# Patient Record
Sex: Male | Born: 1945 | Race: White | Hispanic: No | Marital: Married | Smoking: Never smoker
Health system: Southern US, Community
[De-identification: ages and names within clinical notes are randomized; demographics above are authoritative.]

## PROBLEM LIST (undated history)

## (undated) DIAGNOSIS — K219 Gastro-esophageal reflux disease without esophagitis: Secondary | ICD-10-CM

## (undated) DIAGNOSIS — M1990 Unspecified osteoarthritis, unspecified site: Secondary | ICD-10-CM

## (undated) DIAGNOSIS — E119 Type 2 diabetes mellitus without complications: Secondary | ICD-10-CM

## (undated) DIAGNOSIS — K7460 Unspecified cirrhosis of liver: Secondary | ICD-10-CM

## (undated) DIAGNOSIS — I2510 Atherosclerotic heart disease of native coronary artery without angina pectoris: Secondary | ICD-10-CM

## (undated) DIAGNOSIS — C61 Malignant neoplasm of prostate: Secondary | ICD-10-CM

## (undated) DIAGNOSIS — R790 Abnormal level of blood mineral: Secondary | ICD-10-CM

## (undated) DIAGNOSIS — L409 Psoriasis, unspecified: Secondary | ICD-10-CM

## (undated) HISTORY — PX: CHOLECYSTECTOMY: SHX55

## (undated) HISTORY — PX: APPENDECTOMY: SHX54

## (undated) HISTORY — DX: Abnormal level of blood mineral: R79.0

## (undated) HISTORY — PX: OTHER SURGICAL HISTORY: SHX169

## (undated) HISTORY — PX: EYE SURGERY: SHX253

## (undated) HISTORY — PX: VASECTOMY: SHX75

## (undated) HISTORY — PX: PROSTATE BIOPSY: SHX241

## (undated) HISTORY — PX: PATELLA FRACTURE SURGERY: SHX735

---

## 1988-10-01 HISTORY — PX: PATELLA FRACTURE SURGERY: SHX735

## 1989-10-01 HISTORY — PX: OTHER SURGICAL HISTORY: SHX169

## 2001-02-25 ENCOUNTER — Ambulatory Visit (HOSPITAL_COMMUNITY): Admission: RE | Admit: 2001-02-25 | Discharge: 2001-02-25 | Payer: Self-pay | Admitting: Gastroenterology

## 2001-02-25 ENCOUNTER — Encounter (INDEPENDENT_AMBULATORY_CARE_PROVIDER_SITE_OTHER): Payer: Self-pay | Admitting: Specialist

## 2002-12-02 ENCOUNTER — Ambulatory Visit (HOSPITAL_COMMUNITY): Admission: RE | Admit: 2002-12-02 | Discharge: 2002-12-02 | Payer: Self-pay | Admitting: *Deleted

## 2002-12-02 ENCOUNTER — Encounter (INDEPENDENT_AMBULATORY_CARE_PROVIDER_SITE_OTHER): Payer: Self-pay | Admitting: *Deleted

## 2003-10-02 HISTORY — PX: CORONARY ARTERY BYPASS GRAFT: SHX141

## 2003-10-19 ENCOUNTER — Ambulatory Visit (HOSPITAL_COMMUNITY): Admission: RE | Admit: 2003-10-19 | Discharge: 2003-10-19 | Payer: Self-pay | Admitting: Cardiology

## 2003-10-21 ENCOUNTER — Ambulatory Visit (HOSPITAL_COMMUNITY): Admission: RE | Admit: 2003-10-21 | Discharge: 2003-10-22 | Payer: Self-pay | Admitting: Cardiology

## 2003-10-25 ENCOUNTER — Inpatient Hospital Stay (HOSPITAL_COMMUNITY): Admission: EM | Admit: 2003-10-25 | Discharge: 2003-11-02 | Payer: Self-pay | Admitting: *Deleted

## 2003-10-26 ENCOUNTER — Encounter (INDEPENDENT_AMBULATORY_CARE_PROVIDER_SITE_OTHER): Payer: Self-pay | Admitting: Specialist

## 2003-11-29 ENCOUNTER — Encounter
Admission: RE | Admit: 2003-11-29 | Discharge: 2003-11-29 | Payer: Self-pay | Admitting: Thoracic Surgery (Cardiothoracic Vascular Surgery)

## 2005-03-03 ENCOUNTER — Encounter: Payer: Self-pay | Admitting: Emergency Medicine

## 2005-03-03 ENCOUNTER — Ambulatory Visit: Payer: Self-pay | Admitting: Pulmonary Disease

## 2005-03-03 ENCOUNTER — Inpatient Hospital Stay (HOSPITAL_COMMUNITY): Admission: AD | Admit: 2005-03-03 | Discharge: 2005-03-20 | Payer: Self-pay | Admitting: *Deleted

## 2005-03-03 ENCOUNTER — Ambulatory Visit: Payer: Self-pay | Admitting: Infectious Diseases

## 2005-03-05 ENCOUNTER — Encounter: Payer: Self-pay | Admitting: Cardiology

## 2005-03-12 ENCOUNTER — Encounter (INDEPENDENT_AMBULATORY_CARE_PROVIDER_SITE_OTHER): Payer: Self-pay | Admitting: *Deleted

## 2005-04-09 ENCOUNTER — Inpatient Hospital Stay (HOSPITAL_COMMUNITY): Admission: EM | Admit: 2005-04-09 | Discharge: 2005-04-13 | Payer: Self-pay | Admitting: Emergency Medicine

## 2005-04-12 ENCOUNTER — Encounter (INDEPENDENT_AMBULATORY_CARE_PROVIDER_SITE_OTHER): Payer: Self-pay | Admitting: Specialist

## 2005-04-14 ENCOUNTER — Ambulatory Visit (HOSPITAL_COMMUNITY): Admission: RE | Admit: 2005-04-14 | Discharge: 2005-04-14 | Payer: Self-pay | Admitting: *Deleted

## 2005-04-21 ENCOUNTER — Inpatient Hospital Stay (HOSPITAL_COMMUNITY): Admission: EM | Admit: 2005-04-21 | Discharge: 2005-04-27 | Payer: Self-pay | Admitting: Emergency Medicine

## 2005-04-21 ENCOUNTER — Ambulatory Visit: Payer: Self-pay | Admitting: Internal Medicine

## 2006-10-12 ENCOUNTER — Ambulatory Visit (HOSPITAL_COMMUNITY): Admission: RE | Admit: 2006-10-12 | Discharge: 2006-10-12 | Payer: Self-pay | Admitting: Orthopedic Surgery

## 2008-12-04 ENCOUNTER — Inpatient Hospital Stay (HOSPITAL_COMMUNITY): Admission: EM | Admit: 2008-12-04 | Discharge: 2008-12-10 | Payer: Self-pay | Admitting: Emergency Medicine

## 2008-12-04 ENCOUNTER — Ambulatory Visit: Payer: Self-pay | Admitting: Emergency Medicine

## 2009-12-12 ENCOUNTER — Encounter: Admission: RE | Admit: 2009-12-12 | Discharge: 2009-12-12 | Payer: Self-pay | Admitting: Gastroenterology

## 2010-08-01 ENCOUNTER — Encounter: Admission: RE | Admit: 2010-08-01 | Discharge: 2010-08-01 | Payer: Self-pay | Admitting: Gastroenterology

## 2010-08-15 ENCOUNTER — Ambulatory Visit (HOSPITAL_COMMUNITY): Admission: RE | Admit: 2010-08-15 | Discharge: 2010-08-15 | Payer: Self-pay | Admitting: Cardiology

## 2010-10-22 ENCOUNTER — Encounter: Payer: Self-pay | Admitting: *Deleted

## 2010-10-30 ENCOUNTER — Encounter
Admission: RE | Admit: 2010-10-30 | Discharge: 2010-10-30 | Payer: Self-pay | Source: Home / Self Care | Attending: Cardiology | Admitting: Cardiology

## 2010-11-09 ENCOUNTER — Other Ambulatory Visit: Payer: Self-pay | Admitting: Gastroenterology

## 2010-11-09 DIAGNOSIS — K7460 Unspecified cirrhosis of liver: Secondary | ICD-10-CM

## 2010-11-14 ENCOUNTER — Ambulatory Visit
Admission: RE | Admit: 2010-11-14 | Discharge: 2010-11-14 | Disposition: A | Discharge status: Home / Self Care | Payer: BLUE CROSS/BLUE SHIELD | Source: Ambulatory Visit | Attending: Gastroenterology | Admitting: Gastroenterology

## 2010-11-14 DIAGNOSIS — K7460 Unspecified cirrhosis of liver: Secondary | ICD-10-CM

## 2010-11-14 MED ORDER — IOHEXOL 300 MG/ML  SOLN
100.0000 mL | Freq: Once | INTRAMUSCULAR | Status: AC | PRN
  Administered 2010-11-14: 100 mL via INTRAVENOUS

## 2010-12-07 ENCOUNTER — Other Ambulatory Visit: Payer: Self-pay | Admitting: Gastroenterology

## 2010-12-08 ENCOUNTER — Ambulatory Visit
Admission: RE | Admit: 2010-12-08 | Discharge: 2010-12-08 | Disposition: A | Discharge status: Home / Self Care | Payer: Self-pay | Source: Ambulatory Visit | Attending: Gastroenterology | Admitting: Gastroenterology

## 2010-12-08 ENCOUNTER — Other Ambulatory Visit (HOSPITAL_COMMUNITY): Payer: Self-pay | Admitting: Gastroenterology

## 2010-12-08 DIAGNOSIS — K7460 Unspecified cirrhosis of liver: Secondary | ICD-10-CM

## 2010-12-10 ENCOUNTER — Inpatient Hospital Stay (HOSPITAL_COMMUNITY)
Admission: EM | Admit: 2010-12-10 | Discharge: 2010-12-14 | DRG: 552 | Disposition: A | Discharge status: Home / Self Care | Payer: BC Managed Care – HMO | Attending: Internal Medicine | Admitting: Internal Medicine

## 2010-12-10 ENCOUNTER — Inpatient Hospital Stay (HOSPITAL_COMMUNITY): Payer: BC Managed Care – HMO

## 2010-12-10 DIAGNOSIS — Z951 Presence of aortocoronary bypass graft: Secondary | ICD-10-CM

## 2010-12-10 DIAGNOSIS — I10 Essential (primary) hypertension: Secondary | ICD-10-CM | POA: Diagnosis present

## 2010-12-10 DIAGNOSIS — K7460 Unspecified cirrhosis of liver: Secondary | ICD-10-CM | POA: Diagnosis present

## 2010-12-10 DIAGNOSIS — R188 Other ascites: Secondary | ICD-10-CM | POA: Diagnosis present

## 2010-12-10 DIAGNOSIS — A0472 Enterocolitis due to Clostridium difficile, not specified as recurrent: Secondary | ICD-10-CM | POA: Diagnosis present

## 2010-12-10 DIAGNOSIS — K2211 Ulcer of esophagus with bleeding: Principal | ICD-10-CM | POA: Diagnosis present

## 2010-12-10 DIAGNOSIS — K652 Spontaneous bacterial peritonitis: Secondary | ICD-10-CM | POA: Diagnosis present

## 2010-12-10 DIAGNOSIS — L4050 Arthropathic psoriasis, unspecified: Secondary | ICD-10-CM | POA: Diagnosis present

## 2010-12-10 DIAGNOSIS — D696 Thrombocytopenia, unspecified: Secondary | ICD-10-CM | POA: Diagnosis present

## 2010-12-10 DIAGNOSIS — I2510 Atherosclerotic heart disease of native coronary artery without angina pectoris: Secondary | ICD-10-CM | POA: Diagnosis present

## 2010-12-10 DIAGNOSIS — D62 Acute posthemorrhagic anemia: Secondary | ICD-10-CM | POA: Diagnosis present

## 2010-12-10 LAB — URINALYSIS, ROUTINE W REFLEX MICROSCOPIC
Bilirubin Urine: NEGATIVE
Glucose, UA: NEGATIVE mg/dL
Hgb urine dipstick: NEGATIVE
Ketones, ur: NEGATIVE mg/dL
Nitrite: NEGATIVE
Protein, ur: NEGATIVE mg/dL
Specific Gravity, Urine: 1.018 (ref 1.005–1.030)
Urobilinogen, UA: 1 mg/dL (ref 0.0–1.0)
pH: 6 (ref 5.0–8.0)

## 2010-12-10 LAB — COMPREHENSIVE METABOLIC PANEL
ALT: 23 U/L (ref 0–53)
AST: 49 U/L — ABNORMAL HIGH (ref 0–37)
Albumin: 2 g/dL — ABNORMAL LOW (ref 3.5–5.2)
CO2: 24 mEq/L (ref 19–32)
Calcium: 7.9 mg/dL — ABNORMAL LOW (ref 8.4–10.5)
Creatinine, Ser: 1.32 mg/dL (ref 0.4–1.5)
GFR calc Af Amer: >60 mL/min (ref >60)
Sodium: 130 mEq/L — ABNORMAL LOW (ref 135–145)

## 2010-12-10 LAB — DIFFERENTIAL
Basophils Absolute: 0 10*3/uL (ref 0.0–0.1)
Eosinophils Absolute: 0.2 10*3/uL (ref 0.0–0.7)
Eosinophils Relative: 4 % (ref 0–5)
Lymphocytes Relative: 23 % (ref 12–46)
Lymphs Abs: 1.3 10*3/uL (ref 0.7–4.0)
Neutrophils Relative %: 59 % (ref 43–77)

## 2010-12-10 LAB — LIPASE, BLOOD: Lipase: 39 U/L (ref 11–59)

## 2010-12-10 LAB — GLUCOSE, CAPILLARY: Glucose-Capillary: 106 mg/dL — ABNORMAL HIGH (ref 70–99)

## 2010-12-10 LAB — CBC
HCT: 26.6 % — ABNORMAL LOW (ref 39.0–52.0)
MCV: 93.3 fL (ref 78.0–100.0)
Platelets: 84 10*3/uL — ABNORMAL LOW (ref 150–400)
RBC: 2.85 MIL/uL — ABNORMAL LOW (ref 4.22–5.81)
RDW: 14.5 % (ref 11.5–15.5)
WBC: 5.5 10*3/uL (ref 4.0–10.5)

## 2010-12-10 LAB — OCCULT BLOOD, POC DEVICE: Fecal Occult Bld: POSITIVE

## 2010-12-10 LAB — PROTIME-INR: INR: 1.78 — ABNORMAL HIGH (ref 0.00–1.49)

## 2010-12-11 ENCOUNTER — Other Ambulatory Visit (HOSPITAL_COMMUNITY): Payer: BC Managed Care – HMO

## 2010-12-11 LAB — CBC
HCT: 22 % — ABNORMAL LOW (ref 39.0–52.0)
Hemoglobin: 8 g/dL — ABNORMAL LOW (ref 13.0–17.0)
Hemoglobin: 8.8 g/dL — ABNORMAL LOW (ref 13.0–17.0)
MCH: 32.3 pg (ref 26.0–34.0)
MCH: 32 pg (ref 26.0–34.0)
MCHC: 34.5 g/dL (ref 30.0–36.0)
MCHC: 35 g/dL (ref 30.0–36.0)
MCV: 93.5 fL (ref 78.0–100.0)
MCV: 94 fL (ref 78.0–100.0)
MCV: 93.9 fL (ref 78.0–100.0)
Platelets: 63 10*3/uL — ABNORMAL LOW (ref 150–400)
Platelets: 88 10*3/uL — ABNORMAL LOW (ref 150–400)
Platelets: 68 10*3/uL — ABNORMAL LOW (ref 150–400)
RBC: 2.78 MIL/uL — ABNORMAL LOW (ref 4.22–5.81)
RBC: 2.74 MIL/uL — ABNORMAL LOW (ref 4.22–5.81)
RDW: 14.8 % (ref 11.5–15.5)
RDW: 14.7 % (ref 11.5–15.5)
RDW: 15.7 % — ABNORMAL HIGH (ref 11.5–15.5)
WBC: 3.8 10*3/uL — ABNORMAL LOW (ref 4.0–10.5)
WBC: 5.5 10*3/uL (ref 4.0–10.5)
WBC: 3.8 10*3/uL — ABNORMAL LOW (ref 4.0–10.5)

## 2010-12-11 LAB — BASIC METABOLIC PANEL
Calcium: 7.5 mg/dL — ABNORMAL LOW (ref 8.4–10.5)
Creatinine, Ser: 1.1 mg/dL (ref 0.4–1.5)
GFR calc Af Amer: >60 mL/min (ref >60)
GFR calc non Af Amer: >60 mL/min (ref >60)
Sodium: 134 mEq/L — ABNORMAL LOW (ref 135–145)

## 2010-12-11 LAB — HEPATIC FUNCTION PANEL
Alkaline Phosphatase: 102 U/L (ref 39–117)
Bilirubin, Direct: 0.6 mg/dL — ABNORMAL HIGH (ref 0.0–0.3)
Total Bilirubin: 2.7 mg/dL — ABNORMAL HIGH (ref 0.3–1.2)

## 2010-12-11 LAB — CARDIAC PANEL(CRET KIN+CKTOT+MB+TROPI)
CK, MB: 1.1 ng/mL (ref 0.3–4.0)
CK, MB: 1.2 ng/mL (ref 0.3–4.0)
Relative Index: INVALID (ref 0.0–2.5)
Total CK: 54 U/L (ref 7–232)
Troponin I: 0.03 ng/mL (ref 0.00–0.06)

## 2010-12-11 LAB — GLUCOSE, CAPILLARY
Glucose-Capillary: 95 mg/dL (ref 70–99)
Glucose-Capillary: 100 mg/dL — ABNORMAL HIGH (ref 70–99)
Glucose-Capillary: 87 mg/dL (ref 70–99)

## 2010-12-11 LAB — PROTIME-INR
INR: 2.04 — ABNORMAL HIGH (ref 0.00–1.49)
Prothrombin Time: 23.2 seconds — ABNORMAL HIGH (ref 11.6–15.2)

## 2010-12-11 LAB — FOLATE: Folate: >20 ng/mL

## 2010-12-11 LAB — VITAMIN B12: Vitamin B-12: 522 pg/mL (ref 211–911)

## 2010-12-11 LAB — FERRITIN: Ferritin: 31 ng/mL (ref 22–322)

## 2010-12-11 LAB — HEMOGLOBIN A1C: Hgb A1c MFr Bld: 4.8 % (ref <5.7)

## 2010-12-11 LAB — CLOSTRIDIUM DIFFICILE BY PCR

## 2010-12-11 LAB — MAGNESIUM: Magnesium: 1.6 mg/dL (ref 1.5–2.5)

## 2010-12-12 ENCOUNTER — Inpatient Hospital Stay (HOSPITAL_COMMUNITY): Payer: BC Managed Care – HMO

## 2010-12-12 ENCOUNTER — Other Ambulatory Visit: Payer: Self-pay | Admitting: Interventional Radiology

## 2010-12-12 LAB — URINE CULTURE
Colony Count: NO GROWTH
Culture  Setup Time: 201203120935
Special Requests: NEGATIVE

## 2010-12-12 LAB — GLUCOSE, CAPILLARY
Glucose-Capillary: 94 mg/dL (ref 70–99)
Glucose-Capillary: 81 mg/dL (ref 70–99)

## 2010-12-12 LAB — COMPREHENSIVE METABOLIC PANEL
Albumin: 1.9 g/dL — ABNORMAL LOW (ref 3.5–5.2)
Alkaline Phosphatase: 94 U/L (ref 39–117)
BUN: 18 mg/dL (ref 6–23)
Chloride: 107 mEq/L (ref 96–112)
Creatinine, Ser: 1.01 mg/dL (ref 0.4–1.5)
Glucose, Bld: 100 mg/dL — ABNORMAL HIGH (ref 70–99)
Total Bilirubin: 3.6 mg/dL — ABNORMAL HIGH (ref 0.3–1.2)

## 2010-12-12 LAB — BODY FLUID CELL COUNT WITH DIFFERENTIAL
Eos, Fluid: 0 %
Neutrophil Count, Fluid: 14 % (ref 0–25)

## 2010-12-12 LAB — CBC
HCT: 21.9 % — ABNORMAL LOW (ref 39.0–52.0)
HCT: 24.9 % — ABNORMAL LOW (ref 39.0–52.0)
MCH: 32.2 pg (ref 26.0–34.0)
MCH: 32.3 pg (ref 26.0–34.0)
MCHC: 34.9 g/dL (ref 30.0–36.0)
MCV: 91.6 fL (ref 78.0–100.0)
Platelets: 61 10*3/uL — ABNORMAL LOW (ref 150–400)
RDW: 15.5 % (ref 11.5–15.5)
RDW: 15.7 % — ABNORMAL HIGH (ref 11.5–15.5)

## 2010-12-12 LAB — PREPARE FRESH FROZEN PLASMA: Unit division: 0

## 2010-12-12 LAB — ALBUMIN, FLUID (OTHER): Albumin, Fluid: <1 g/dL

## 2010-12-12 LAB — PROTIME-INR
INR: 1.74 — ABNORMAL HIGH (ref 0.00–1.49)
Prothrombin Time: 20.5 seconds — ABNORMAL HIGH (ref 11.6–15.2)

## 2010-12-12 LAB — SURGICAL PCR SCREEN: Staphylococcus aureus: NEGATIVE

## 2010-12-13 LAB — CBC
HCT: 27.5 % — ABNORMAL LOW (ref 39.0–52.0)
Hemoglobin: 9.5 g/dL — ABNORMAL LOW (ref 13.0–17.0)
MCHC: 34.5 g/dL (ref 30.0–36.0)
WBC: 4.5 10*3/uL (ref 4.0–10.5)

## 2010-12-13 LAB — BASIC METABOLIC PANEL
Chloride: 108 mEq/L (ref 96–112)
GFR calc Af Amer: >60 mL/min (ref >60)
Potassium: 3.9 mEq/L (ref 3.5–5.1)

## 2010-12-13 LAB — PATHOLOGIST SMEAR REVIEW

## 2010-12-13 LAB — GLUCOSE, CAPILLARY
Glucose-Capillary: 129 mg/dL — ABNORMAL HIGH (ref 70–99)
Glucose-Capillary: 119 mg/dL — ABNORMAL HIGH (ref 70–99)
Glucose-Capillary: 127 mg/dL — ABNORMAL HIGH (ref 70–99)

## 2010-12-13 LAB — PROTIME-INR: INR: 1.79 — ABNORMAL HIGH (ref 0.00–1.49)

## 2010-12-14 ENCOUNTER — Other Ambulatory Visit (HOSPITAL_COMMUNITY): Payer: Self-pay

## 2010-12-14 LAB — CROSSMATCH
ABO/RH(D): B POS
Unit division: 0
Unit division: 0
Unit division: 0

## 2010-12-14 LAB — CBC
MCH: 33 pg (ref 26.0–34.0)
MCV: 93.2 fL (ref 78.0–100.0)
Platelets: 72 10*3/uL — ABNORMAL LOW (ref 150–400)
RDW: 15.4 % (ref 11.5–15.5)

## 2010-12-17 LAB — CULTURE, BLOOD (ROUTINE X 2)
Culture  Setup Time: 201203120918
Culture: NO GROWTH

## 2010-12-21 NOTE — Discharge Summary (Signed)
[name], [name]                ACCOUNT NO.:  [id number]  MEDICAL RECORD NO.:  [id number]           PATIENT TYPE:  I  LOCATION:  2506                         FACILITY:  [facility]  PHYSICIAN:  Alan Massed, MD    DATE OF BIRTH:  [birth year]  DATE OF ADMISSION:  12/10/2010 DATE OF DISCHARGE:                        DISCHARGE SUMMARY - REFERRING   PRIMARY CARE PRACTITIONER:  Dr. Aida [name].  PRIMARY GASTROENTEROLOGIST:  Alan Friar, MD.  PRIMARY CARDIOLOGIST:  Alan Kotyk R. Jacinto Halim, MD  PRIMARY DISCHARGE DIAGNOSES: 1. Upper GI bleed secondary to distal esophageal ulcer. 2. Abdominal pain presumed secondary to spontaneous bacterial     peritonitis. 3. Ascites status post paracentesis. 4. C. difficile colitis.  SECONDARY DISCHARGE DIAGNOSES: 1. Cirrhosis of the liver. 2. Coronary artery disease status post CABG. 3. Hypertension. 4. History of psoriatic arthritis. 5. History of gastroesophageal reflux disease. 6. 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  DISCHARGE MEDICATIONS: 1. Ciprofloxacin 500 mg 1 tablet p.o. twice daily. 2. Lasix 40 mg 1 tablet p.o. daily. 3. Metronidazole 500 mg 1 tablet p.o. 3 times a day for 8 more days. 4. Protonix 40 mg 1 tablet p.o. daily. 5. Inderal 10 mg 1 tablet twice daily. 6. Aldactone 50 mg 1 tablet p.o. daily. 7. Sucralfate 1 gram p.o. 4 times a day. 8. Aspirin 81 mg 1 tablet daily. 9. Calcium carbonate/vitamin D 600/400, 1 tablet p.o. daily. 10.Enbrel 50 mg 1 tablet p.o. weekly. 11.Fish oil 1200 mg 3 capsules p.o. daily. 12.Tramadol 50 mg 1 tablet p.o. daily. 13.Folic acid 0.4 mg 1 tablet daily. 14.Vitamin D3 400 units 1 capsule p.o. daily. 15.Flora-Q 1 tablet p.o. daily.  CONSULTATIONS:  Dr. Vassie [name] from [facility].  HISTORY OF PRESENT ILLNESS:  The patient is a very pleasant 65 year old white male with the above-noted medical problems who was brought in on December 10, 2010 for abdominal swelling and pain.  He also claimed he  was having melanotic stools.  He was then admitted to the hospitalist service for further evaluation and treatment.  For further details, please see the history and physical that was dictated by Dr. Toniann [name] on admission.  PROCEDURES PERFORMED:  The patient underwent a ultrasound guided paracentesis on December 12, 2010 with removal of 4.5 liters of fluid.  RADIOLOGICAL STUDIES:  CT of the abdomen and pelvis done on December 11, 2010 showed cirrhosis, ascites, and portal venous hypertension. Diverticulosis without evidence of acute diverticulitis.  No evidence of bowel obstruction.  Extensive coronary artery calcification.  CBC on discharge showed hemoglobin of 8.7, platelet count of 72,000.  BRIEF HOSPITAL COURSE: 1. Melanotic stools.  The patient did have evidence of melena on     admission.  Since his INR was high, he was given fresh frozen     plasma.  He was seen in consult by Dr. Dulce [name] and EGD was done     which showed a distal esophageal ulcer.  The patient was then     subsequently placed on sucralfate and Protonix.  I did speak with     Dr. Dulce [name] today and he was okay with 81 mg of aspirin to  continue     with.  The patient will follow with [facility] for further management     as well.  Please note his stools are now regular in color.  The     patient does have some amount of anemia and his hemoglobin has been     8.7 at the time of discharge.  His hemoglobin on admission was 9.0. 2. C. difficile colitis.  The patient did also have evidence of     diarrhea on admission and his C. difficile PCR was sent which came     back positive and subsequently the patient has been placed on     Flagyl which he will continue as noted above. 3. Abdominal pain.  This was of unknown etiology.  CT scan of the     abdomen was negative for any significant acute pathology.  However,     given his history of ascites and underlying cirrhosis, the patient     was empirically started on cephalosporins  for spontaneous bacterial     peritonitis.  He was already on antibiotics for more than 48 hours     when paracentesis was done.  Therefore at this time as GI is     recommending that this patient be treated with ciprofloxacin on     discharge to complete a 10-day course. 4. Underlying cirrhosis with ascites.  The patient as noted above is     status post paracentesis.  He has also been placed on Lasix,     Aldactone, and Inderal which will be provided to the patient on     discharge.  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  He also has coagulopathy secondary to cirrhosis as     well. 5. History of psoriatic arthritis.  This is stable.  He will resume     Enbrel on discharge. 6. History of coronary artery disease status post CABG.  As per my     discussion with Dr. Dulce [name], it is okay for the patient due to     resume a baby aspirin.  DISPOSITION:  The patient is stable to be discharged home.  FOLLOWUP INSTRUCTIONS: 1. The patient will follow up with Dr. Bosie [name] within 1-2 weeks upon     discharge.  He is to call and make an appointment. 2. The patient will follow up with his primary care practitioner, Dr.     Clarene [name] within 1-2 weeks upon discharge.  He     is to call and make an appointment. 3. The patient is to consume a low sodium diet. 4. Total time spent 45 minutes.     Alan Massed, MD     SG/MEDQ  D:  12/14/2010  T:  12/14/2010  Job:  [id number]  cc:   [name] Alan Friar, MD Alan Modena, MD Alan Hilts Jacinto Halim, MD  Electronically Signed by [name]  on 12/21/2010 08:26:47 PM

## 2011-01-01 NOTE — H&P (Signed)
[name], [name]                ACCOUNT NO.:  [id number]  MEDICAL RECORD NO.:  [id number]           PATIENT TYPE:  I  LOCATION:  2506                         FACILITY:  [facility]  PHYSICIAN:  Eduard Clos, MDDATE OF BIRTH:  12/24/1945  DATE OF ADMISSION:  12/10/2010 DATE OF DISCHARGE:                             HISTORY & PHYSICAL   PRIMARY CARE PHYSICIAN:  Dr. Aida Puffer.  PRIMARY GASTROENTEROLOGIST:  Shirley Friar, MD  PRIMARY CARDIOLOGIST:  Cristy Hilts. Jacinto Halim, MD  CHIEF COMPLAINT:  Abdominal swelling and pain.  HISTORY OF PRESENT ILLNESS:  A 65 year old male with known history of cirrhosis of liver, psoriatic arthritis, hypertension and coronary disease status post CABG, has been not feeling well and feeling poor over the last couple of weeks.  The patient states that he was treated for pneumonia 3 weeks ago by his primary care physician and subsequent to that he has been feeling a little weak, tired, and his abdomen has been swelling.  He was scheduled to have paracentesis next week for which he had a sonogram done on December 08, 2010, results of which shows mild to moderate ascites with hepatic cirrhosis and moderate splenomegaly and also had previous cholecystectomy.  The patient today had at least 10 bouts of bowel movement.  He states it is not diarrhea but black tarry stools.  He did not have any nausea or vomiting,  He did have 1 episode last week which he said was dark but was not obviously bloody.  In addition, the patient has been having increasing distention of the abdomen with pain particularly in the right upper quadrant which is dull, aching, sometimes colicky, has no relation to food or bowel movements.  The patient also has been having a subjective feeling of fever and chills.  The patient denies any chest pain.  Denies any palpitation, does not have any dizziness.  No loss of consciousness, did not have any focal deficit.  Did not have any dysuria  or discharges.  In the ER, the patient was found to have a hemoglobin around 9 with thrombocytopenia, mildly elevated INR.  The patient has been admitted for further workup.  PAST MEDICAL HISTORY: 1. History of CAD status post CABG. 2. History of hypertension. 3. History of cirrhosis of liver secondary to medication. 4. History of psoriatic arthritis on Enbrel now. 5. Previous history of Streptococcus pyogenes bacteriemia presumed     secondary to bacterial peritonitis. 6. History of GERD.  PAST SURGICAL HISTORY: 1. Cholecystectomy. 2. Appendectomy. 3. Repair of left femoral fracture. 4. CABG.  MEDICATIONS ON ADMISSION:  The patient is on torsemide and spironolactone, doses are not known.  He is on Enbrel per week.  He also takes fish oil, tramadol p.r.n., vitamin D3, calcium, and folic acid.  ALLERGIES:  No known drug allergies.  FAMILY HISTORY:  Significant for diabetes and CHF in the patient's mother and psoriatic problems in the patient's father.  SOCIAL HISTORY:  The patient is married, lives with his wife.  Denies smoking cigarettes, drinking alcohol, or using illegal drugs.  REVIEW OF SYSTEMS:  As per history of present illness,  nothing else significant.  PHYSICAL EXAMINATION:  GENERAL:  The patient examined at bedside, not in acute distress. VITAL SIGNS:  Blood pressure 114/76, pulse 99 per minute, temperature is 99.7 rectally, respirations 20 per minute, O2 sat 98%. HEENT:  Anicteric.  Mild pallor.  No facial asymmetry.  Tongue is midline. NECK:  No neck rigidity. CHEST:  Bilateral air entry present.  No rhonchi, no crepitation. HEART:  S1, S2  heard. ABDOMEN:  Distended.  Clinically looks like mild ascites.  Nontender. Bowel sounds present.  No guarding, no rigidity.  I do not see any discoloration now. CNS:  Alert, awake, oriented to time, place, and person.  Moves upper and lower extremities 5/5. EXTREMITIES:  There are small bruises in the lower  extremities.  There are no acute ischemic changes, cyanosis, or clubbing.  LABORATORY DATA:  EKG shows sinus rhythm, atrial premature complexes. Heart rate is around 89 beats per minute with poor R-wave progression and nonspecific ST-T changes, low-voltage.  Ultrasound of abdomen done on December 08, 2010 shows previous cholecystectomy.  No evidence of biliary ductal dilatation, hepatic cirrhosis, moderate splenomegaly, and mild-to- moderate ascites.  No definite change appreciated compared with prior exam.  CBC; WBC is 5.5, hemoglobin is 9, hematocrit is 26.6, platelets 84.  PT/INR is 20.9 and 1.7.  Complete metabolic panel; sodium 130, potassium 3.4, chloride 100, carbon dioxide 24, glucose 138, BUN 20, creatinine 1.3, total bilirubin is 3.1, alkaline  phosphatase 121, AST 29, ALT 23.  Total protein 6.4, albumin 2, calcium is 7.9, lipase 39. UA is negative.  Fecal occult blood is positive.  ASSESSMENT: 1. Gastrointestinal bleed. 2. Abdominal pain, probably multifactorial.     a.     Colitis.     b.     Spontaneous bacterial peritonitis.     c.     Increasing ascites. 3. Recent use of antibiotics and increased bowel movements. 4. 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 5. History of psoriatic arthritis, on immunosuppressants. 6. Coronary artery disease status post coronary artery bypass graft,     presently chest pain free. 7. Previous history of sepsis secondary bacterial peritonitis.  PLAN:  At this time, 1. Admit the patient to telemetry. 2. For his anemia and guaiac-positive, we are going to place the     patient n.p.o.  I am going to place the patient on a Protonix drip.     I am going to type and crossmatch 3 units PRBC and hold.  Recheck     CBC stat now and if needed transfuse.  We will have low threshold     for transfusion.  Give vitamin K 5 mg as his INR is slightly high.     I already consulted Dr. Randa Evens of GI who will be seeing the     patient later.  We  will check an anemia profile. 3. Abdominal pain with ascites at this time is concerning for SBP and     colitis and as the patient has recent antibiotics, I am going to     get a CT abdomen and pelvis with just p.o. contrast.  Check stool     studies.  I am going to empirically cover with antibiotics,     vancomycin, ceftazidime, and Flagyl which can later be tapered     based on the cultures and clinical course. 4. I am going to get a chest x-ray and 2-D echo. 5. At this time, the patient does not have any chest pain, but  I am     going to cycle the cardiac markers given his cardiac history and we     will have a low threshold for transfusion since the patient has     thrombocytopenia and also history of CABG. 6. The patient does have jaundice.  A recent sonogram did not show any     obstructive features.  We are going to closely follow his     bilirubin. 7. Further recommendation as condition evolves.  His albumin is also     low which also should be  DICTATION ENDED AT THIS POINT.     Eduard Clos, MD     ANK/MEDQ  D:  12/10/2010  T:  12/10/2010  Job:  [id number]  cc:   Aida Puffer Shirley Friar, MD Cristy Hilts Jacinto Halim, MD  Electronically Signed by Midge Minium MD on 01/01/2011 07:42:08 AM

## 2011-01-11 LAB — URINALYSIS, ROUTINE W REFLEX MICROSCOPIC
Hgb urine dipstick: NEGATIVE
Protein, ur: NEGATIVE mg/dL
Urobilinogen, UA: 1 mg/dL (ref 0.0–1.0)

## 2011-01-11 LAB — BASIC METABOLIC PANEL
BUN: 14 mg/dL (ref 6–23)
BUN: 14 mg/dL (ref 6–23)
CO2: 17 mEq/L — ABNORMAL LOW (ref 19–32)
CO2: 18 mEq/L — ABNORMAL LOW (ref 19–32)
CO2: 23 mEq/L (ref 19–32)
CO2: 24 mEq/L (ref 19–32)
Calcium: 8 mg/dL — ABNORMAL LOW (ref 8.4–10.5)
Calcium: 8.3 mg/dL — ABNORMAL LOW (ref 8.4–10.5)
Chloride: 107 mEq/L (ref 96–112)
Chloride: 110 mEq/L (ref 96–112)
Chloride: 111 mEq/L (ref 96–112)
Chloride: 114 mEq/L — ABNORMAL HIGH (ref 96–112)
Creatinine, Ser: 0.83 mg/dL (ref 0.4–1.5)
Creatinine, Ser: 1.07 mg/dL (ref 0.4–1.5)
GFR calc Af Amer: >60 mL/min (ref >60)
GFR calc Af Amer: >60 mL/min (ref >60)
GFR calc Af Amer: >60 mL/min (ref >60)
GFR calc non Af Amer: >60 mL/min (ref >60)
Glucose, Bld: 213 mg/dL — ABNORMAL HIGH (ref 70–99)
Glucose, Bld: 232 mg/dL — ABNORMAL HIGH (ref 70–99)
Glucose, Bld: 92 mg/dL (ref 70–99)
Glucose, Bld: 97 mg/dL (ref 70–99)
Potassium: 3.7 mEq/L (ref 3.5–5.1)
Sodium: 131 mEq/L — ABNORMAL LOW (ref 135–145)
Sodium: 141 mEq/L (ref 135–145)

## 2011-01-11 LAB — BLOOD GAS, ARTERIAL
Acid-base deficit: 6.7 mmol/L — ABNORMAL HIGH (ref 0.0–2.0)
Drawn by: 308601
FIO2: 0.21 %
O2 Saturation: 93.8 %
Patient temperature: 98.6
pO2, Arterial: 72.4 mmHg — ABNORMAL LOW (ref 80.0–100.0)

## 2011-01-11 LAB — DIC (DISSEMINATED INTRAVASCULAR COAGULATION)PANEL
D-Dimer, Quant: 2.11 ug/mL-FEU — ABNORMAL HIGH (ref 0.00–0.48)
Fibrinogen: 259 mg/dL (ref 204–475)
INR: 1.4 (ref 0.00–1.49)
aPTT: 33 seconds (ref 24–37)

## 2011-01-11 LAB — COMPREHENSIVE METABOLIC PANEL
Albumin: 2.3 g/dL — ABNORMAL LOW (ref 3.5–5.2)
Albumin: 3 g/dL — ABNORMAL LOW (ref 3.5–5.2)
Alkaline Phosphatase: 109 U/L (ref 39–117)
Alkaline Phosphatase: 81 U/L (ref 39–117)
Alkaline Phosphatase: 82 U/L (ref 39–117)
BUN: 16 mg/dL (ref 6–23)
BUN: 17 mg/dL (ref 6–23)
BUN: 19 mg/dL (ref 6–23)
BUN: 20 mg/dL (ref 6–23)
Calcium: 8.5 mg/dL (ref 8.4–10.5)
Chloride: 110 mEq/L (ref 96–112)
Chloride: 111 mEq/L (ref 96–112)
Creatinine, Ser: 0.83 mg/dL (ref 0.4–1.5)
Creatinine, Ser: 1.08 mg/dL (ref 0.4–1.5)
Creatinine, Ser: 1.27 mg/dL (ref 0.4–1.5)
Glucose, Bld: 149 mg/dL — ABNORMAL HIGH (ref 70–99)
Glucose, Bld: 156 mg/dL — ABNORMAL HIGH (ref 70–99)
Potassium: 3.2 mEq/L — ABNORMAL LOW (ref 3.5–5.1)
Potassium: 4.2 mEq/L (ref 3.5–5.1)
Potassium: 4.3 mEq/L (ref 3.5–5.1)
Total Bilirubin: 3.2 mg/dL — ABNORMAL HIGH (ref 0.3–1.2)
Total Bilirubin: 3.7 mg/dL — ABNORMAL HIGH (ref 0.3–1.2)
Total Protein: 5 g/dL — ABNORMAL LOW (ref 6.0–8.3)
Total Protein: 5.1 g/dL — ABNORMAL LOW (ref 6.0–8.3)
Total Protein: 6.1 g/dL (ref 6.0–8.3)

## 2011-01-11 LAB — CARDIAC PANEL(CRET KIN+CKTOT+MB+TROPI)
Relative Index: 1.9 (ref 0.0–2.5)
Troponin I: 0.01 ng/mL (ref 0.00–0.06)

## 2011-01-11 LAB — CBC
HCT: 34 % — ABNORMAL LOW (ref 39.0–52.0)
HCT: 34.2 % — ABNORMAL LOW (ref 39.0–52.0)
HCT: 34.5 % — ABNORMAL LOW (ref 39.0–52.0)
HCT: 35.3 % — ABNORMAL LOW (ref 39.0–52.0)
HCT: 39.2 % (ref 39.0–52.0)
HCT: 41.1 % (ref 39.0–52.0)
HCT: 43.1 % (ref 39.0–52.0)
Hemoglobin: 11.8 g/dL — ABNORMAL LOW (ref 13.0–17.0)
Hemoglobin: 12.1 g/dL — ABNORMAL LOW (ref 13.0–17.0)
Hemoglobin: 13.7 g/dL (ref 13.0–17.0)
Hemoglobin: 14.1 g/dL (ref 13.0–17.0)
MCHC: 34.2 g/dL (ref 30.0–36.0)
MCHC: 34.3 g/dL (ref 30.0–36.0)
MCHC: 34.4 g/dL (ref 30.0–36.0)
MCHC: 34.9 g/dL (ref 30.0–36.0)
MCV: 93.6 fL (ref 78.0–100.0)
MCV: 94.7 fL (ref 78.0–100.0)
MCV: 95.4 fL (ref 78.0–100.0)
MCV: 95.8 fL (ref 78.0–100.0)
MCV: 96.8 fL (ref 78.0–100.0)
Platelets: 52 10*3/uL — ABNORMAL LOW (ref 150–400)
Platelets: 60 10*3/uL — ABNORMAL LOW (ref 150–400)
Platelets: 64 10*3/uL — ABNORMAL LOW (ref 150–400)
Platelets: 71 10*3/uL — ABNORMAL LOW (ref 150–400)
RBC: 3.56 MIL/uL — ABNORMAL LOW (ref 4.22–5.81)
RBC: 4.39 MIL/uL (ref 4.22–5.81)
RDW: 13.3 % (ref 11.5–15.5)
RDW: 13.8 % (ref 11.5–15.5)
RDW: 14 % (ref 11.5–15.5)
RDW: 14.1 % (ref 11.5–15.5)
RDW: 14.2 % (ref 11.5–15.5)
RDW: 14.4 % (ref 11.5–15.5)
RDW: 14.6 % (ref 11.5–15.5)
WBC: 4.9 10*3/uL (ref 4.0–10.5)
WBC: 8.6 10*3/uL (ref 4.0–10.5)
WBC: 9.1 10*3/uL (ref 4.0–10.5)

## 2011-01-11 LAB — POCT I-STAT, CHEM 8
Calcium, Ion: 1.09 mmol/L — ABNORMAL LOW (ref 1.12–1.32)
Glucose, Bld: 97 mg/dL (ref 70–99)
HCT: 46 % (ref 39.0–52.0)
TCO2: 21 mmol/L (ref 0–100)

## 2011-01-11 LAB — DIFFERENTIAL
Basophils Absolute: 0.1 10*3/uL (ref 0.0–0.1)
Eosinophils Absolute: 0 10*3/uL (ref 0.0–0.7)
Lymphocytes Relative: 7 % — ABNORMAL LOW (ref 12–46)
Lymphocytes Relative: 9 % — ABNORMAL LOW (ref 12–46)
Lymphs Abs: 0.9 10*3/uL (ref 0.7–4.0)
Monocytes Absolute: 0.3 10*3/uL (ref 0.1–1.0)
Monocytes Absolute: 0.8 10*3/uL (ref 0.1–1.0)
Monocytes Relative: 8 % (ref 3–12)
Neutro Abs: 8.2 10*3/uL — ABNORMAL HIGH (ref 1.7–7.7)
Neutrophils Relative %: 83 % — ABNORMAL HIGH (ref 43–77)
Neutrophils Relative %: 89 % — ABNORMAL HIGH (ref 43–77)

## 2011-01-11 LAB — CARBOXYHEMOGLOBIN
Carboxyhemoglobin: 1.5 % (ref 0.5–1.5)
Methemoglobin: 1.4 % (ref 0.0–1.5)
O2 Saturation: 77.9 %
Total hemoglobin: 13.6 g/dL (ref 13.5–18.0)
Total hemoglobin: 14.3 g/dL (ref 13.5–18.0)

## 2011-01-11 LAB — CULTURE, BLOOD (ROUTINE X 2): Culture: NO GROWTH

## 2011-01-11 LAB — HEPARIN ANTIBODY SCREEN: Heparin Antibody Screen: POSITIVE

## 2011-01-11 LAB — AMYLASE: Amylase: 46 U/L (ref 27–131)

## 2011-01-11 LAB — PHOSPHORUS
Phosphorus: 1.4 mg/dL — ABNORMAL LOW (ref 2.3–4.6)
Phosphorus: 4.6 mg/dL (ref 2.3–4.6)
Phosphorus: <1 mg/dL — CL (ref 2.3–4.6)

## 2011-01-11 LAB — BILIRUBIN, FRACTIONATED(TOT/DIR/INDIR)
Bilirubin, Direct: 0.6 mg/dL — ABNORMAL HIGH (ref 0.0–0.3)
Indirect Bilirubin: 1.8 mg/dL — ABNORMAL HIGH (ref 0.3–0.9)
Total Bilirubin: 2.4 mg/dL — ABNORMAL HIGH (ref 0.3–1.2)

## 2011-01-11 LAB — LACTIC ACID, PLASMA
Lactic Acid, Venous: 2 mmol/L (ref 0.5–2.2)
Lactic Acid, Venous: 3.6 mmol/L — ABNORMAL HIGH (ref 0.5–2.2)
Lactic Acid, Venous: 3.8 mmol/L — ABNORMAL HIGH (ref 0.5–2.2)

## 2011-01-11 LAB — URINE CULTURE

## 2011-01-11 LAB — TYPE AND SCREEN

## 2011-01-11 LAB — CALCIUM, IONIZED: Calcium, Ion: 1.16 mmol/L (ref 1.12–1.32)

## 2011-01-11 LAB — PROTIME-INR
INR: 1.4 (ref 0.00–1.49)
INR: 1.6 — ABNORMAL HIGH (ref 0.00–1.49)
Prothrombin Time: 17.5 seconds — ABNORMAL HIGH (ref 11.6–15.2)

## 2011-01-11 LAB — MRSA CULTURE

## 2011-01-11 LAB — APTT: aPTT: 47 seconds — ABNORMAL HIGH (ref 24–37)

## 2011-01-11 LAB — HAPTOGLOBIN: Haptoglobin: 115 mg/dL (ref 16–200)

## 2011-01-11 LAB — ABO/RH: ABO/RH(D): B POS

## 2011-01-11 LAB — MAGNESIUM: Magnesium: 1.9 mg/dL (ref 1.5–2.5)

## 2011-01-11 LAB — PLT GLYCOPROTEIN (INDIRECT) AUTOABS: Plt GP Ia/IIa Autoabs: NOT DETECTED

## 2011-01-11 LAB — D-DIMER, QUANTITATIVE: D-Dimer, Quant: 0.56 ug/mL-FEU — ABNORMAL HIGH (ref 0.00–0.48)

## 2011-01-11 LAB — TSH: TSH: 0.57 u[IU]/mL (ref 0.350–4.500)

## 2011-01-11 LAB — AMMONIA: Ammonia: 29 umol/L (ref 11–35)

## 2011-01-11 LAB — CORTISOL: Cortisol, Plasma: 42.3 ug/dL

## 2011-01-17 ENCOUNTER — Other Ambulatory Visit: Payer: Self-pay | Admitting: Gastroenterology

## 2011-01-17 ENCOUNTER — Encounter (HOSPITAL_COMMUNITY): Payer: Medicare Other | Attending: Gastroenterology

## 2011-01-17 DIAGNOSIS — D649 Anemia, unspecified: Secondary | ICD-10-CM | POA: Insufficient documentation

## 2011-01-17 DIAGNOSIS — K7460 Unspecified cirrhosis of liver: Secondary | ICD-10-CM | POA: Insufficient documentation

## 2011-01-18 ENCOUNTER — Other Ambulatory Visit: Payer: Self-pay | Admitting: Gastroenterology

## 2011-01-18 ENCOUNTER — Encounter (HOSPITAL_COMMUNITY): Payer: Medicare Other

## 2011-01-18 LAB — CBC
HCT: 28.6 % — ABNORMAL LOW (ref 39.0–52.0)
Hemoglobin: 9.9 g/dL — ABNORMAL LOW (ref 13.0–17.0)
MCHC: 34.6 g/dL (ref 30.0–36.0)
RDW: 17.7 % — ABNORMAL HIGH (ref 11.5–15.5)
WBC: 3.6 10*3/uL — ABNORMAL LOW (ref 4.0–10.5)

## 2011-01-18 LAB — DIFFERENTIAL
Basophils Absolute: 0 10*3/uL (ref 0.0–0.1)
Basophils Relative: 1 % (ref 0–1)
Eosinophils Relative: 4 % (ref 0–5)
Lymphocytes Relative: 31 % (ref 12–46)
Monocytes Absolute: 0.5 10*3/uL (ref 0.1–1.0)
Neutro Abs: 1.8 10*3/uL (ref 1.7–7.7)

## 2011-01-19 LAB — CROSSMATCH
ABO/RH(D): B POS
Antibody Screen: NEGATIVE
Unit division: 0
Unit division: 0

## 2011-01-24 NOTE — Consult Note (Signed)
[name], [name]                ACCOUNT NO.:  [id number]  MEDICAL RECORD NO.:  [id number]           PATIENT TYPE:  I  LOCATION:  2506                         FACILITY:  [facility]  PHYSICIAN:  Willis Modena, MD     DATE OF BIRTH:  Aug 22, 1946  DATE OF CONSULTATION:  12/11/2010 DATE OF DISCHARGE:                                CONSULTATION   REASON FOR CONSULTATION:  Melena and weakness.  CHIEF COMPLAINT:  Weakness.  HISTORY OF PRESENT ILLNESS:  [name] is a 65 year old gentleman with history of cirrhosis.  He has psoriatic arthritis, on immunosuppressant therapy and has had recurrent bouts of pneumonia, although his recent chest x-ray yesterday shows no obvious infiltrates.  He presents to the hospital with progressive weakness.  He has been having progressive weakness for the past several weeks.  He has had intermittent black stools over this time, but had several episodes in a row yesterday, which prompted this evaluation.  He has also noticed some progressive worsening in his abdominal distention, much more so than his lower extremity distention.  He has no hematemesis.  He did have an endoscopy done about a year ago that showed some small, grade 1 esophageal varices as well as portal hypertensive gastropathy.  PAST MEDICAL HISTORY:  Per dictated note from Dr. Toniann Fail, dictated December 10, 2010, that I have reviewed and I agree to.  PAST SURGICAL HISTORY:  Per dictated note from Dr. Toniann Fail, dictated December 10, 2010, that I have reviewed and I agree to.  HOME MEDICATIONS:  Per dictated note from Dr. Toniann Fail, dictated December 10, 2010, that I have reviewed and I agree to.  ALLERGIES:  Per dictated note from Dr. Toniann Fail, dictated December 10, 2010, that I have reviewed and I agree to.  FAMILY HISTORY:  Per dictated note from Dr. Toniann Fail, dictated December 10, 2010, that I have reviewed and I agree to.  SOCIAL HISTORY:  Per dictated note from Dr. Toniann Fail, dictated  December 10, 2010, that I have reviewed and I agree to.  REVIEW OF SYSTEMS:  Per dictated note from Dr. Toniann Fail, dictated December 10, 2010, that I have reviewed and I agree to.  PHYSICAL EXAMINATION:  VITAL SIGNS:  Blood pressure about 120/70, heart rate is 80, and oxygen saturation about 95% on a couple liters. GENERAL:  [name] is chronically ill appearing, appears somewhat older than stated age.  He is not acutely toxic-appearing. HEENT:  Eyes:  Sclerae are slightly icteric.  Conjunctivae pale.  ENT: No oropharyngeal lesions. NECK:  Supple. LUNGS:  Poor aeration throughout, but no focal findings. HEART:  Regular. ABDOMEN:  Distended with ascites, mild to moderate.  Positive shifting dullness.  No obviously palpable liver or splenic enlargement, although this could be missed due to his abdominal distention.  No focal tenderness, has some mild generalized tenderness.  Bowel sounds normoactive. EXTREMITIES:  Pitting edema 1+ to knees bilaterally. SKIN:  Some scattered ecchymoses, telangiectasias, and cherry angiomata. NEUROLOGIC:  Diffusely weak, nonfocal without lateralizing signs. PSYCHIATRIC:  Normal mood and affect. LYMPHATICS:  No palpable axillary, submandibular, or supraclavicular adenopathy.  LABORATORY STUDIES:  White count is  3.8, platelet count is 63, hemoglobin 7.7, and INR is 2.04.  Sodium 134, potassium 3.3, chloride 107, bicarb 21, BUN 22, and creatinine 1.1.  Total bilirubin is 2.7, indirect is 2.1, AST 43, ALT 21, total protein 5.6, albumin 1.7, and lipase normal.  CK enzymes normal.  Urinalysis normal.  Hemoccult times one is positive.  RADIOLOGY STUDIES:  Chest x-ray done yesterday shows mild chronic interstitial disease, but no acute infiltrates.  He had an abdominal ultrasound done on March 9 that showed a nodular liver consistent with cirrhosis, mild to moderate ascites, moderate splenomegaly, prior cholecystectomy, and no biliary dilatation.  IMPRESSION:   [name] is a 65 year old gentleman with cirrhosis, presenting with melena and anemia.  His symptoms are seemingly subacute over the past couple weeks.  He is not overtly hemorrhaging and is not hemodynamically unstable.  PLAN: 1. Would pursue endoscopy today after he is given a unit of blood and     a couple units of fresh frozen plasma. 2. Would continue empiric antibiotics in the setting of GI bleed with     known ascites and cirrhosis. 3. Continue Protonix drip. 4. Will ultimately need a paracentesis, but we will defer until the     endoscopy is done. 5. We will follow along with you.  Thank you for allowing [name] to participate in the care of [name].     Willis Modena, MD     WO/MEDQ  D:  12/11/2010  T:  12/12/2010  Job:  [id number]  Electronically Signed by Willis Modena  on 01/24/2011 01:58:15 PM

## 2011-01-29 ENCOUNTER — Emergency Department (HOSPITAL_COMMUNITY): Payer: Medicare Other

## 2011-01-29 ENCOUNTER — Emergency Department (HOSPITAL_COMMUNITY)
Admission: EM | Admit: 2011-01-29 | Discharge: 2011-01-29 | Disposition: A | Discharge status: Home / Self Care | Payer: Medicare Other | Attending: Emergency Medicine | Admitting: Emergency Medicine

## 2011-01-29 DIAGNOSIS — K7460 Unspecified cirrhosis of liver: Secondary | ICD-10-CM | POA: Insufficient documentation

## 2011-01-29 DIAGNOSIS — Z7982 Long term (current) use of aspirin: Secondary | ICD-10-CM | POA: Insufficient documentation

## 2011-01-29 DIAGNOSIS — M069 Rheumatoid arthritis, unspecified: Secondary | ICD-10-CM | POA: Insufficient documentation

## 2011-01-29 DIAGNOSIS — K219 Gastro-esophageal reflux disease without esophagitis: Secondary | ICD-10-CM | POA: Insufficient documentation

## 2011-01-29 DIAGNOSIS — I10 Essential (primary) hypertension: Secondary | ICD-10-CM | POA: Insufficient documentation

## 2011-01-29 DIAGNOSIS — Z79899 Other long term (current) drug therapy: Secondary | ICD-10-CM | POA: Insufficient documentation

## 2011-01-29 DIAGNOSIS — K769 Liver disease, unspecified: Secondary | ICD-10-CM | POA: Insufficient documentation

## 2011-01-29 DIAGNOSIS — R4182 Altered mental status, unspecified: Secondary | ICD-10-CM | POA: Insufficient documentation

## 2011-01-29 DIAGNOSIS — F29 Unspecified psychosis not due to a substance or known physiological condition: Secondary | ICD-10-CM | POA: Insufficient documentation

## 2011-01-29 DIAGNOSIS — R279 Unspecified lack of coordination: Secondary | ICD-10-CM | POA: Insufficient documentation

## 2011-01-29 LAB — URINALYSIS, ROUTINE W REFLEX MICROSCOPIC
Ketones, ur: NEGATIVE mg/dL
Leukocytes, UA: NEGATIVE
Nitrite: NEGATIVE
Protein, ur: NEGATIVE mg/dL
pH: 7.5 (ref 5.0–8.0)

## 2011-01-29 LAB — DIFFERENTIAL
Basophils Absolute: 0 10*3/uL (ref 0.0–0.1)
Basophils Relative: 1 % (ref 0–1)
Eosinophils Absolute: 0.2 10*3/uL (ref 0.0–0.7)
Eosinophils Relative: 5 % (ref 0–5)
Monocytes Absolute: 0.4 10*3/uL (ref 0.1–1.0)
Monocytes Relative: 11 % (ref 3–12)

## 2011-01-29 LAB — CBC
Hemoglobin: 12.5 g/dL — ABNORMAL LOW (ref 13.0–17.0)
MCH: 31.1 pg (ref 26.0–34.0)
MCHC: 35 g/dL (ref 30.0–36.0)
Platelets: 68 10*3/uL — ABNORMAL LOW (ref 150–400)
RDW: 18.6 % — ABNORMAL HIGH (ref 11.5–15.5)

## 2011-01-29 LAB — HEPATIC FUNCTION PANEL
Alkaline Phosphatase: 128 U/L — ABNORMAL HIGH (ref 39–117)
Indirect Bilirubin: 2.3 mg/dL — ABNORMAL HIGH (ref 0.3–0.9)
Total Protein: 8.3 g/dL (ref 6.0–8.3)

## 2011-01-29 LAB — AMMONIA: Ammonia: 46 umol/L — ABNORMAL HIGH (ref 11–35)

## 2011-01-29 LAB — RAPID URINE DRUG SCREEN, HOSP PERFORMED
Benzodiazepines: NOT DETECTED
Cocaine: NOT DETECTED
Tetrahydrocannabinol: NOT DETECTED

## 2011-01-29 LAB — BASIC METABOLIC PANEL
BUN: 16 mg/dL (ref 6–23)
Calcium: 8.8 mg/dL (ref 8.4–10.5)
Chloride: 110 mEq/L (ref 96–112)
Creatinine, Ser: 0.98 mg/dL (ref 0.4–1.5)
GFR calc Af Amer: >60 mL/min (ref >60)

## 2011-01-29 LAB — URINE MICROSCOPIC-ADD ON

## 2011-02-08 ENCOUNTER — Emergency Department (HOSPITAL_COMMUNITY): Payer: Medicare Other

## 2011-02-08 ENCOUNTER — Inpatient Hospital Stay (HOSPITAL_COMMUNITY)
Admission: EM | Admit: 2011-02-08 | Discharge: 2011-02-10 | DRG: 443 | Disposition: A | Discharge status: Home / Self Care | Payer: Medicare Other | Source: Ambulatory Visit | Attending: Internal Medicine | Admitting: Internal Medicine

## 2011-02-08 DIAGNOSIS — K7682 Hepatic encephalopathy: Principal | ICD-10-CM | POA: Diagnosis present

## 2011-02-08 DIAGNOSIS — K219 Gastro-esophageal reflux disease without esophagitis: Secondary | ICD-10-CM | POA: Diagnosis present

## 2011-02-08 DIAGNOSIS — E785 Hyperlipidemia, unspecified: Secondary | ICD-10-CM | POA: Diagnosis present

## 2011-02-08 DIAGNOSIS — K7460 Unspecified cirrhosis of liver: Secondary | ICD-10-CM | POA: Diagnosis present

## 2011-02-08 DIAGNOSIS — Z951 Presence of aortocoronary bypass graft: Secondary | ICD-10-CM

## 2011-02-08 DIAGNOSIS — Z6827 Body mass index (BMI) 27.0-27.9, adult: Secondary | ICD-10-CM

## 2011-02-08 DIAGNOSIS — E119 Type 2 diabetes mellitus without complications: Secondary | ICD-10-CM | POA: Diagnosis present

## 2011-02-08 DIAGNOSIS — T451X5A Adverse effect of antineoplastic and immunosuppressive drugs, initial encounter: Secondary | ICD-10-CM | POA: Diagnosis present

## 2011-02-08 DIAGNOSIS — Z7982 Long term (current) use of aspirin: Secondary | ICD-10-CM

## 2011-02-08 DIAGNOSIS — I2510 Atherosclerotic heart disease of native coronary artery without angina pectoris: Secondary | ICD-10-CM | POA: Diagnosis present

## 2011-02-08 DIAGNOSIS — K7290 Hepatic failure, unspecified without coma: Principal | ICD-10-CM | POA: Diagnosis present

## 2011-02-08 LAB — POCT I-STAT 3, ART BLOOD GAS (G3+)
Patient temperature: 98.6
pH, Arterial: 7.466 — ABNORMAL HIGH (ref 7.350–7.450)

## 2011-02-08 LAB — URINALYSIS, ROUTINE W REFLEX MICROSCOPIC
Bilirubin Urine: NEGATIVE
Ketones, ur: NEGATIVE mg/dL
Nitrite: NEGATIVE
Protein, ur: NEGATIVE mg/dL
Specific Gravity, Urine: 1.022 (ref 1.005–1.030)
Urobilinogen, UA: 1 mg/dL (ref 0.0–1.0)

## 2011-02-08 LAB — CBC
MCH: 30.9 pg (ref 26.0–34.0)
Platelets: 34 10*3/uL — ABNORMAL LOW (ref 150–400)
RBC: 3.82 MIL/uL — ABNORMAL LOW (ref 4.22–5.81)
WBC: 4.4 10*3/uL (ref 4.0–10.5)

## 2011-02-08 LAB — COMPREHENSIVE METABOLIC PANEL
ALT: 45 U/L (ref 0–53)
Calcium: 8.9 mg/dL (ref 8.4–10.5)
GFR calc Af Amer: >60 mL/min (ref >60)
Glucose, Bld: 126 mg/dL — ABNORMAL HIGH (ref 70–99)
Sodium: 136 mEq/L (ref 135–145)
Total Protein: 7.2 g/dL (ref 6.0–8.3)

## 2011-02-08 LAB — BASIC METABOLIC PANEL
Calcium: 8.9 mg/dL (ref 8.4–10.5)
Creatinine, Ser: 0.74 mg/dL (ref 0.4–1.5)
GFR calc Af Amer: >60 mL/min (ref >60)
GFR calc non Af Amer: >60 mL/min (ref >60)
Sodium: 134 mEq/L — ABNORMAL LOW (ref 135–145)

## 2011-02-08 LAB — DIFFERENTIAL
Basophils Absolute: 0 10*3/uL (ref 0.0–0.1)
Basophils Relative: 1 % (ref 0–1)
Eosinophils Absolute: 0.2 10*3/uL (ref 0.0–0.7)
Neutrophils Relative %: 51 % (ref 43–77)

## 2011-02-08 LAB — RAPID URINE DRUG SCREEN, HOSP PERFORMED: Tetrahydrocannabinol: NOT DETECTED

## 2011-02-08 LAB — AMMONIA: Ammonia: 43 umol/L (ref 11–60)

## 2011-02-08 LAB — PROTIME-INR: INR: 1.49 (ref 0.00–1.49)

## 2011-02-08 LAB — GLUCOSE, CAPILLARY: Glucose-Capillary: 115 mg/dL — ABNORMAL HIGH (ref 70–99)

## 2011-02-08 LAB — APTT: aPTT: 35 seconds (ref 24–37)

## 2011-02-08 LAB — MRSA PCR SCREENING: MRSA by PCR: POSITIVE — AB

## 2011-02-08 LAB — ETHANOL: Alcohol, Ethyl (B): <11 mg/dL — ABNORMAL HIGH (ref 0–10)

## 2011-02-09 LAB — COMPREHENSIVE METABOLIC PANEL
ALT: 46 U/L (ref 0–53)
Albumin: 2.1 g/dL — ABNORMAL LOW (ref 3.5–5.2)
Alkaline Phosphatase: 129 U/L — ABNORMAL HIGH (ref 39–117)
BUN: 17 mg/dL (ref 6–23)
Calcium: 8.9 mg/dL (ref 8.4–10.5)
Potassium: 3.8 mEq/L (ref 3.5–5.1)
Sodium: 136 mEq/L (ref 135–145)
Total Protein: 6.9 g/dL (ref 6.0–8.3)

## 2011-02-09 LAB — PROTIME-INR
INR: 1.53 — ABNORMAL HIGH (ref 0.00–1.49)
Prothrombin Time: 18.6 seconds — ABNORMAL HIGH (ref 11.6–15.2)

## 2011-02-09 LAB — CBC
Hemoglobin: 12 g/dL — ABNORMAL LOW (ref 13.0–17.0)
Platelets: 56 10*3/uL — ABNORMAL LOW (ref 150–400)
RBC: 3.92 MIL/uL — ABNORMAL LOW (ref 4.22–5.81)

## 2011-02-09 LAB — URINE CULTURE
Colony Count: NO GROWTH
Culture: NO GROWTH

## 2011-02-09 LAB — AMMONIA: Ammonia: 36 umol/L (ref 11–60)

## 2011-02-09 NOTE — Consult Note (Signed)
[name], [name]                ACCOUNT NO.:  [id number]  MEDICAL RECORD NO.:  [id number]           PATIENT TYPE:  I  LOCATION:  3312                         FACILITY:  [facility]  PHYSICIAN:  Bernette Redbird, M.D.   DATE OF BIRTH:  10-06-1945  DATE OF CONSULTATION:  02/08/2011 DATE OF DISCHARGE:                                CONSULTATION   Dr. Jerral Ralph of [facility] asked [name] to see this 65 year old gentleman because of hepatic encephalopathy.  This patient has a longstanding history of chronic liver disease, felt to be secondary to methotrexate use for psoriasis.  Liver disease was first noted about 6 years ago when he presented with a common bile duct stone, status post cholecystectomy.  However, his liver disease was well compensated until a couple months ago when he was in the hospital with a GI bleed due to an esophageal ulceration, and at the same time, was noted to have significant ascites and had a large volume paracentesis. Then, about 10 days ago, he was very inappropriate and confused at home, urinating on the living room table, severely confused, and so forth.  He was seen in the emergency room and released, being started on lactulose for hepatic encephalopathy.  Since that time, the patient has had problems with lethargy, and also severe somnolence especially in the morning.  However, this morning it got to the point where literally his wife could not wake him up at all.  She called [name] and we directed him to be brought to the hospital by EMS.  In the emergency room, the patient was deeply obtunded, but barely arousable, and found to have a significant elevation of ammonia level, specifically, 151, and he was thus felt to be encephalopathic.  There is no obvious precipitating factor for the encephalopathy. Specifically, the patient has not recently had any GI bleeding since his episode in March, he is not on sedatives, he has not been dehydrated, nor has he had  any obvious infection.  There has been a slight cough, but his chest x-ray here in the emergency room was clear.  PAST MEDICAL HISTORY:  No known allergies.  OUTPATIENT MEDICATIONS: 1. Lactulose 10 grams t.i.d. 2. Tramadol 50 mg once daily p.r.n. 3. Spironolactone 100 mg daily. 4. Propranolol 10 mg b.i.d. 5. Pantoprazole 40 mg daily. 6. Furosemide 40 mg each morning. 7. Folic acid. 8. Flora-Q Probiotic. 9. Fish oil. 10.Enbrel 50 mg once a week. 11.Calcium carbonate. 12.Daily baby aspirin.  OPERATIONS:  Include, prior coronary artery bypass graft surgery, cholecystectomy, and surgery on his hip and leg, also status post appendectomy.  Medical illnesses include psoriasis, type 2 diabetes, common duct stone, requiring ERCP about 6 years ago, coronary artery disease, hyperlipidemia, chronic liver disease related to methotrexate, GERD with esophageal ulceration and GI bleed a couple of months ago.  HABITS:  Lifelong nonsmoker, nondrinker.  FAMILY HISTORY:  Negative for colon cancer, polyps, or liver disease.  SOCIAL HISTORY:  Married.  Wife at bedside.  He is a retired Teaching laboratory technician.  REVIEW OF SYSTEMS:  Negative for reversal of sleep patterns, but positive for being very  slow in recent weeks.  No recent melenic stools.  PHYSICAL EXAM:  GENERAL:  A well-developed Caucasian male, deeply obtunded, some spontaneous movement, no response to moderate stimulation, slightly jaundiced. CHEST:  Grossly clear, although he does not cooperate by following commands for deep breathing. HEART:  Unremarkable. ABDOMEN:  Pertinent for the absence of frank ascites, liver nonpalpable, spleen nonpalpable.  No guarding, mass effect, or tenderness.  The patient just had a pasty light brown stool and I did not do rectal exam, but the stool specimen will be sent for occult blood analysis, certainly no evidence of melena at this time.  LABS:  Current hemoglobin is 11.8, which is down from  12.5 ten days earlier, but is up from 9.9 in 3 weeks ago, raising the question of possible dehydration and hemoconcentration.  White count 4400, platelets 34,000.  Differential count unremarkable.  INR 1.5.  Chemistry panel pertinent for bilirubin 2.6, down from 2.9 ten days ago, alk phos 181 compared to 128 ten days ago, AST 66, ALT 45, which are basically unchanged from ten days ago, albumin 2.1, ammonia 151 as compared to 46 when checked in the emergency room ten days ago.  Alcohol none detected. Looking back at the patient's labs from two years ago, at that time, his platelet count was 46,000, albumin was 2.3, bilirubin was 3.2, so the current labs are not dramatically changed from where the patient had been a couple of years ago, except for the fact that he now has a significantly elevated ammonia level.  RADIOGRAPHIC STUDIES:  The patient had a head CT without any acute findings and a portable chest being essentially clear with a little bit of possible infiltrate or atelectasis at the right base without evidence of consolidation.  IMPRESSION: 1. 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  BUN 16, creatinine 0.8,     not suggestive of severe dehydration, although, urinary specific     gravity was slightly high on admission at 1.022. 2. Chronic liver disease, never biopsy proven, consistent with     cirrhosis as evidenced of portal hypertension (ascites,     thrombocytopenia) and hepatic synthetic dysfunction     (hyperbilirubinemia, elevated pro-time, encephalopathy).  RECOMMENDATIONS: 1. Gentle hydration.  We want to get the patient well hydrated, but we     want to avoid him retaining too much     fluid. 2. Lactulose. 3. Rifaximin once the patient is able to take p.o. as adjunctive     therapy for his encephalopathy.           ______________________________ Bernette Redbird, M.D.     RB/MEDQ  D:  02/08/2011  T:  02/09/2011  Job:  [id number]  cc:   Shirley Friar, MD Aida Puffer, MD  Electronically Signed by Bernette Redbird M.D. on 02/09/2011 10:15:57 AM

## 2011-02-10 LAB — URINALYSIS, ROUTINE W REFLEX MICROSCOPIC
Bilirubin Urine: NEGATIVE
Glucose, UA: NEGATIVE mg/dL
Ketones, ur: NEGATIVE mg/dL
Protein, ur: NEGATIVE mg/dL
Urobilinogen, UA: 0.2 mg/dL (ref 0.0–1.0)

## 2011-02-10 LAB — URINE MICROSCOPIC-ADD ON

## 2011-02-11 NOTE — H&P (Signed)
[name], [name] NO.:  [id number]  MEDICAL RECORD NO.:  [id number]           PATIENT TYPE:  E  LOCATION:  [facility]                         FACILITY:  [facility]  PHYSICIAN:  [name], MD    DATE OF BIRTH:  [birth year]  DATE OF ADMISSION:  02/08/2011 DATE OF DISCHARGE:                             HISTORY & PHYSICAL   PRIMARY CARE PRACTITIONER:  Aida Puffer, MD  PRIMARY GASTROENTEROLOGIST:  Shirley Friar, MD  PRIMARY CARDIOLOGIST:  Cristy Hilts. Jacinto Halim, MD  CHIEF COMPLAINT:  Altered mental status.  HISTORY OF PRESENT ILLNESS:  The patient is a 65 year old white male with a past medical history of cirrhosis of the liver, history of presumed spontaneous bacterial peritonitis, history of coronary artery disease status post CABG, and history of psoriasis who comes in with the above-noted complaints.  Please note that this patient has altered mental status and is not able to participate in the history taking process, so most of this history is obtained from the patient's spouse at bedside.  Per the patient's spouse even yesterday morning the patient had difficulty getting up and was a little lethargic than usual; however, he was able to maintain well and later during the course of the day yesterday he seemed to be back to his usual baseline.  However, this morning the patient was unarousable and was then brought to the ED for further evaluation.  In the ED, the patient is mumbling incoherently at times and is able to move all of his 4 extremities.  CT of the head done by the ED physician is negative for acute abnormalities.  However, his labs show a significantly elevated ammonia level.  The Hospitalist Service has now been asked to admit this patient for further evaluation. From my history obtained from the patient's wife, the patient does not have any history of diarrhea, abdominal pain.  He denies fever.  There is no history of GI bleeding.  The patient does  not appear to be on any sedative medications as well.  ALLERGIES:  No known drug allergies.  PAST MEDICAL HISTORY: 1. History of cirrhosis of liver presumed secondary to be from     methotrexate. 2. History of presumed spontaneous bacterial peritonitis in March     2012. 3. History of C. diff. 4. History of psoriatic arthritis. 5. History of gastroesophageal reflux disease. 6. History of coronary artery disease status post CABG.  PAST SURGICAL HISTORY: 1. CABG. 2. Cholecystectomy. 3. Appendectomy. 4. Repair of left femoral fracture.  MEDICATIONS AT HOME:  At this time include the following, please note that this is unverified; however, the patient appears to be on, 1. Lactulose 30 mL p.o. three times a day. 2. Tramadol. 3. Lasix. 4. Propranolol. 5. Aldactone.  FAMILY HISTORY:  Significant for diabetes and CHF in the patient's mother.  The patient's father had psoriasis as well.  SOCIAL HISTORY:  The patient lives with his wife and denies any other toxic habits.  REVIEW OF SYSTEMS:  As per HPI, nothing else significant.  PHYSICAL EXAMINATION:  VITAL SIGNS:  Initial vital signs showed temperature of 98.5, heart rate  of 60, blood pressure of 117/74, respirations 16, and pulse ox of 100% on 2 L. GENERAL:  The patient appears to be extremely lethargic and unable to participate in the history taking process.  He seems to be mumbling incoherently; however, he is able to move all of his 4 extremities.  He has no pooling of his secretions in his airway. HEENT:  Atraumatic, normocephalic.  Pupils equally react to light and accommodation. NECK:  Supple. CHEST:  Bilaterally clear to auscultation. CARDIOVASCULAR:  Heart sounds are regular.  No murmurs heard. ABDOMEN:  Soft and nontender, nondistended.  There is no evidence of tense ascites.  He however has some dullness in his flanks. EXTREMITIES:  Trace pedal edema bilaterally. NEUROLOGIC:  Very difficult to evaluate; however,  the patient seems to have no gross motor deficits on exam.  LABORATORY DATA: 1. CBC shows a WBC of 4.4, hemoglobin of 11.8, and platelet count of     34. 2. ABG shows a pH of 7.4, pCO2 of 27.4, pO2 of 111, unknown whether     this was done on room air or on oxygen. 3. INR is 1.49, PTT is 35. 4. Ammonia level is 151. 5. Chemistry shows sodium of 136, potassium of 4.6, chloride of 107,     bicarb of 23, glucose of 126, BUN of 16, creatinine of 0.84, and a     calcium of 8.9. 6. LFT shows a total bilirubin of 2.6, alkaline phosphatase of 181,     AST of 66, ALT of 45, total protein of 7.2, and an albumin of 2.1. 7. Alcohol level is less than 11. 8. LDH is 275. 9. Urinalysis is negative. 10.Urine for drug screen is negative as well.  RADIOLOGICAL STUDIES: 1. CT of the head without contrast showed mild atrophy, no acute     intracranial abnormality. 2. X-ray of the chest showed borderline heart size with no evidence of     pulmonary edema or pleural effusion.  There is an elevation of the     right hemidiaphragm with minimal atelectasis and infiltrate in the     right base without consolidation or pleural effusion being evident.  ASSESSMENT: 1. Altered mental status probably secondary to hepatic encephalopathy.     Unknown what the provoking factor was.  The patient's wife denies     any recent change in medications, he does not appear to be in any     sedative medications as well.  He does not appear to have any overt     signs of infection. 2. History of liver cirrhosis. 3. 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 4. History of coronary artery disease status post coronary artery     bypass grafting. 5. History of hypertension. 6. 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  PLAN: 1. The patient will be admitted to a step-down unit. 2. Since the patient still has altered mental status, he will not be     able to tolerate any p.o. intake for now  and as a result, we will     give him on lactulose retention enemas every 4 to 6 hours. 3. As noted above, he does not have any foci or any evidence of any     precipitating factors for this hepatic encephalopathy and he does     have a history of having presumed spontaneous bacterial peritonitis     in the past.  As a result he will for now be empirically started on  IV Ciprofloxacin until the patient improves clinically. 4. We will gently hydrate him. 5. Rest of his medications will be placed on hold until the patient's     clinical situation improves. 6. This patient will be monitored closely and followed very closely as     well and further plan will depend as the patient's clinical course     evolves. 7. GI consultation has been already called for and Dr. Matthias Hughs will     evaluate and give [name] his recommendations as well. 8. DVT prophylaxis with bilateral SCDs.  He has history of     thrombocytopenia and therefore we will not use any heparin-like     agents. 9. Code status.  Full code.  Total time spent 45 minutes.     [name], MD     SG/MEDQ  D:  02/08/2011  T:  02/08/2011  Job:  [id number]  cc:   Aida Puffer, MD Shirley Friar, MD Cristy Hilts Jacinto Halim, MD  Electronically Signed by [name]  on 02/11/2011 03:05:34 PM

## 2011-02-11 NOTE — Discharge Summary (Signed)
NAMEWOODRUFF, SKIRVIN NO.:  [id number]  MEDICAL RECORD NO.:  [id number]           PATIENT TYPE:  I  LOCATION:  5511                         FACILITY:  [facility]  PHYSICIAN:  [name], MD    DATE OF BIRTH:  Jul 05, 1946  DATE OF ADMISSION:  02/08/2011 DATE OF DISCHARGE:  02/10/2011                        DISCHARGE SUMMARY - REFERRING   PRIMARY CARE PRACTITIONER:  Aida Puffer, MD  PRIMARY GASTROENTEROLOGIST:  Shirley Friar, MD  PRIMARY CARDIOLOGIST:  Pamella Pert, MD  PRIMARY DISCHARGE DIAGNOSIS:  Hepatic encephalopathy, now resolved.  SECONDARY DISCHARGE DIAGNOSES: 1. Cirrhosis of liver. 2. History of presumed spontaneous bacterial peritonitis. 3. History of prior C. diff. 4. History of psoriatic arthritis. 5. History of gastroesophageal reflux disease. 6. History of coronary artery disease status post CABG.  DISCHARGE MEDICATIONS: 1. Lactulose 45 mL p.o. 3 times a day. 2. Rifaximin 550 mg 1 tablet p.o. twice daily. 3. Aspirin 81 mg 1 tablet daily. 4. Calcium carbonate with vitamin D 600/400 mg 1 tablet daily. 5. Enbrel 50 mg subcutaneously every Saturdays. 6. Fish oil 120 units 2 capsules p.o. daily. 7. Flora-Q 1 tablet p.o. daily. 8. Folic acid 0.4 mg 1 tablet daily. 9. Lasix 40 mg 1 tablet p.o. every morning. 10.Protonix 40 mg 1 tablet p.o. daily. 11.Propranolol 10 mg 1 tablet p.o. twice daily. 12.Aldactone 25 mg 4 tablets p.o. daily. 13.Tramadol 50 mg 1 tablet p.o. daily p.r.n.  CONSULTATIONS:  Dr. Dulce Sellar and Dr. Matthias Hughs from [facility].  BRIEF HISTORY OF PRESENT ILLNESS:  The patient is a very pleasant 65- year-old unfortunate male with a history of liver cirrhosis presented to the ED with altered mental status.  For further details, please see the history and physical that was dictated by me on admission.  PERTINENT LABORATORY DATA: 1. Ammonia level on admission was 151. 2. Ammonia level on discharge is 36. 3. Blood cultures  done on Feb 08, 2011 were negative to date.  PERTINENT RADIOLOGICAL STUDIES: 1. CT of the head done on Feb 08, 2011 is negative. 2. Chest x-ray done on Feb 08, 2011 was negative as well.  BRIEF HOSPITAL COURSE: 1. Altered mental status.  This was secondary to hepatic     encephalopathy.  The patient was admitted to the step-down unit.     The patient had a CT of the head to make sure he had no     intracranial events and this was negative.  The patient was unable     to take any p.o. medications because of his altered mental status.     It was felt that he was able to maintain his airway as well.  In     any event, the patient was admitted to the step-down unit, given     lactulose retention enemas.  With that subsequently over a period     of 12-24 hours, he significantly improved.  He was then transferred     to regular medical floor.  He was then transitioned to oral     lactulose and rifaximin was added.  Today on the day of discharge,  the patient is awake and alert.  His wife is at bedside and he is     currently at his baseline.  He was also seen by Physical Therapy     Services and deemed to need no further assistance at home.  He is     being discharged today with the above-noted medications. 2. History of liver cirrhosis.  He follows up with [facility].  He sees     a Location manager at [facility] as well.  We will defer all     further treatment and optimization of these medications to his     primary gastroenterologist.  DISPOSITION:  It is felt that the patient is stable to be discharged home.  FOLLOWUP INSTRUCTIONS: 1. The patient to follow up with his primary care practitioner within     1-2 weeks upon discharge.  He is to call and make an appointment. 2. The patient to follow up with his primary gastroenterologist, Dr.     Bosie Clos within 1-2 weeks upon discharge.  He is to call and make     an appointment. 3. Total time spent coordinating discharge 45  minutes.     [name], MD     SG/MEDQ  D:  02/10/2011  T:  02/10/2011  Job:  [id number]  cc:   Aida Puffer, MD Shirley Friar, MD  Electronically Signed by [name]  on 02/11/2011 03:05:45 PM

## 2011-02-13 NOTE — Consult Note (Signed)
NAMEAKIO, HUDNALL                ACCOUNT NO.:  [id number]   MEDICAL RECORD NO.:  [id number]          PATIENT TYPE:  INP   LOCATION:  0102                         FACILITY:  [facility]   PHYSICIAN:  Newman Pies, MD            DATE OF BIRTH:  25-Dec-1945   DATE OF CONSULTATION:  12/04/2008  DATE OF DISCHARGE:                                 CONSULTATION   CHIEF COMPLAINT:  Facial cellulitis.   HISTORY OF PRESENT ILLNESS:  The patient is a 65 year old male who  presents to the [facility] emergency room December 04, 2008 complaining of  2-day history of fever.  The history was provided by the patient and his  spouse.  According to the wife, the patient experienced an episode of  fever, chills, and diffuse myalgia late last night.  He denies any cough  or sore throat.  He did have 1 episode of emesis.  Earlier this morning,  the patient complained of erythema, warmth, and induration of the left  face, mostly on the left side.  This has progressed across the face and  now involves the left periorbital area and the right midface.  No  significant proptosis or globe entrapment is noted.  There is no obvious  open sore on the face.  The swelling and erythema have progressively  worsened.  The patient has no significant previous history of cellulitis  or sinusitis.  He does have a history of rheumatoid arthritis and is  currently on Enbrel.  He denies any visual change, diplopia, or  significant headache.  He has no previous history of sinonasal surgery.   PAST MEDICAL HISTORY:  Hypertension and rheumatoid arthritis.   PAST SURGICAL HISTORY:  Appendectomy, coronary artery bypass graft,  cholecystectomy.   FAMILY HISTORY:  Noncontributory.   MEDICATIONS:  Aspirin, Enbrel subcu, fish oil, folic acid, metoprolol.   ALLERGIES:  No known drug allergies.   SOCIAL HISTORY:  The patient is a nonsmoker, nondrinker, and no drug  abuse.   TRAVEL HISTORY:  No recent travel to foreign countries.   PHYSICAL  EXAMINATION:  VITALS:  Temperature 98.2, blood pressure 90/53,  pulse 84, respirations 16.   CT of the maxillofacial with contrast.  The results show skin and  subcutaneous edema of the left periorbital and malar regions.  This is  compatible with cellulitis.  No underlying abscess is noted.  Paranasal  sinuses are clear.  The results are consistent with left facial  cellulitis.   LABORATORY DATA:  White blood cell count 9.9, hemoglobin 15, hematocrit  43.1, platelets 71. Sodium is 130, potassium 4.3, chloride 103, glucose  103, BUN 19, creatinine is 1.27, calcium 8.5, total protein 6.1.   PHYSICAL EXAMINATION:  The patient is a well nourished and well  developed white male in no acute distress.  He is alert and oriented x3.  Examination of the face shows moderate edema and erythema over the left  periorbital region, midface, nasal dorsum, and part of the right  midface.  His pupils are equal, round, reactive to light.  Extraocular  motion is intact.  His vision is grossly normal bilaterally.  Examination of the ears shows normal auricles and ear canals.  Cerumen  is noted bilaterally.  Nasal examination shows mild nasal mucosal  congestion.  No obvious purulent drainage is noted.  Oral cavity  examination shows normal lips, gums, tongue, oral cavity and  oropharyngeal mucosa.  Palpation of the neck revealed no lymphadenopathy  or mass.  The trachea is midline.  No stridor is noted.   IMPRESSION:  Left facial cellulitis, extending to the left periorbital  and the nasal dorsum.  There is no evidence of sinusitis or abscess at  this time.   RECOMMENDATIONS:  1. The patient will be admitted to the hospital for IV antibiotics.  2. I will follow the patient's hospital course to evaluate for any      abscess formation.  No surgical intervention is needed at this      time.      Newman Pies, MD  Electronically Signed     ST/MEDQ  D:  12/04/2008  T:  12/05/2008  Job:  [id number]

## 2011-02-13 NOTE — Discharge Summary (Signed)
[name], [name]                ACCOUNT NO.:  [id number]   MEDICAL RECORD NO.:  [id number]          PATIENT TYPE:  INP   LOCATION:  1520                         FACILITY:  [facility]   PHYSICIAN:  Hillery Aldo, M.D.   DATE OF BIRTH:  01/22/1946   DATE OF ADMISSION:  12/04/2008  DATE OF DISCHARGE:  12/10/2008                               DISCHARGE SUMMARY   PRIMARY CARE PHYSICIAN:  Dr. Aida Puffer.   RHEUMATOLOGIST:  Dr. Estill Bakes.   DISCHARGE DIAGNOSES:  1. Septic shock secondary to facial cellulitis with likely staph      aureus infection.  2. Thrombocytopenia.  3. Hyperbilirubinemia.  4. History of coronary artery disease.  5. Mild normocytic anemia.  6. Hyperphosphatasemia.  7. Protein calorie malnutrition.  8. Psoriasis.  9. Hypokalemia.   DISCHARGE MEDICATIONS.:  1. Bactroban intranasally q.12 h.  2. Augmentin 875 mg q.12 h times 7 days.  3. Doxycycline 100 mg q.12 h times 7 days.  4. Aspirin 81 mg p.o. daily.  5. Fish oil 1000 mg daily.  6. Folic acid 1 mg daily.  7. Metoprolol 25 mg daily.   CONSULTATIONS:  1. Dr. Suszanne Conners of Ear, Nose and Throat Medicine.  2. Dr. Delton Coombes of Pulmonary Critical Care Medicine.  3. Dr. Janet Berlin of Ophthalmology.   BRIEF ADMISSION HISTORY OF PRESENT ILLNESS:  The patient is a 65-year-  old male who presented to the hospital with a chief complaint of  swelling about his left face and fever.  The patient had been on Enbrel  for treatment of rheumatoid arthritis.  The patient was admitted for  further evaluation and workup.  For the full details, please see the  dictated report done by Dr.  Callas.   PROCEDURES AND DIAGNOSTIC STUDIES:  1. Chest x-ray on December 04, 2008, showed chronic interstitial changes      without acute or superimposed abnormality.  Previous CABG.  2. CT of the maxillofacial area with contrast on December 04, 2008, showed      left facial cellulitis.  No underlying abscess.  3. Chest x-ray on December 04, 2008, showed  interval placement of a right      internal jugular central line without complication.  No interval      change in the chest.   DISCHARGE LABORATORY VALUES:  1. Staph nasal screen is positive for staph aureus with sensitivities      pending.  2. Blood cultures were negative.  3. Phosphorus was 4.6.  Sodium was 138, potassium 4.2, chloride 109,      bicarb 25, BUN 14, creatinine 1.07, glucose 97.  White blood cell      count was 4.9, hemoglobin 12.0, hematocrit 34.4, platelets 64.      Haptoglobin was 115.  DIC panel showed no schistocytes.  Protime      was slightly elevated 17.4.  PTT was normal at 33.  Fibrinogen was      normal at 259.  D-dimer was elevated at 2.11.  A heparin-induced      thrombocytopenia screen was negative.  Antiplatelet antibodies are  pending at the time of this dictation.   HOSPITAL COURSE:  1. Septic shock secondary to facial cellulitis:  The patient was      admitted and empirically put on Zosyn and vancomycin.  He became      hypotensive and required close co-management with the pulmonary      critical care service.  Blood cultures were obtained and central      access was obtained with an internal jugular line.  He was      monitored closely in the intensive care unit and responded to      treatment with IV fluids.  He did not need pressor support.  Dr.      Suszanne Conners of Ear, Nose and Throat Medicine was consulted, and he felt      the patient could be adequately managed with IV antibiotics.  There      was no surgical indication.  He also was seen in consultation with      Dr. Burgess Estelle of Ophthalmology.  Dr. Burgess Estelle did not feel that the      lacrimal sac was the source of his infection and did not feel there      was any evidence on examination of dacryocystitis.  Recommendations      were to continue antibiotic therapy and outpatient follow-up for      probing and irrigation of his lacrimal drainage system to evaluate      for any signs of obstruction.   This could be done after the acute      infection is fully resolved.  The patient has now completed 7 days      of therapy with vancomycin and Zosyn and has been switched over to      p.o. Augmentin and doxycycline.  Because of concerns for MRSA, a      staph nasal swab culture was performed.  Preliminary culture      results do show staph aureus growing in the culture media.      Sensitivities are not yet back.  Would continue the patient on      Augmentin and doxycycline and have his primary care physician or      Dr. Phylliss Bob follow up with these culture results.  Given the severity      of his illness, would hold Enbrel until his infection is fully      resolved with careful consideration as to his risk of reinfection      if he does have MRSA carious in his nasal passages.  2. Thrombocytopenia:  The patient may have suffered with mild DIC      given his septic shock.  HIT screening was negative.  Antiplatelet      antibodies are pending.  The patient has not had any signs of      bleeding with stable hemoglobin and hematocrit.  DIC panel was      performed on December 08, 2008, which did not show any evidence of      ongoing or active DIC.  3. Hyperbilirubinemia:  The patient's haptoglobin was checked and      found to be within normal limits ruling out hemolysis.  He may have      a benign liver condition such as Sullivan Lone or Antonieta Iba syndrome      to account for this.  He should follow up with his primary care      physician for ongoing evaluation.  4. History of coronary artery disease:  The patient has been      asymptomatic.  He can resume his beta blocker and aspirin at      discharge.  5. Mild normocytic anemia:  The patient's hemoglobin and hematocrit      have remained stable with a discharge hemoglobin of 12.  6. Hyperphosphatasemia:  The patient was appropriately repleted with      potassium, phosphorus.  7. Protein depletion malnutrition:  This is more than likely an  acute      phase reactant rather than a reflection of his nutritional status.      His nutritional stores appear to be adequate.  8. Psoriasis:  The patient has been advised to discontinue Enbrel      until further notice.  9. Hypokalemia:  The patient was appropriately repleted.   DISPOSITION:  The patient is medically stable and will be discharged  home.  He is instructed to follow up with Dr. Clarene Duke in 1-2 weeks.  He  already has follow-up scheduled with Dr. Phylliss Bob in the next 1-2 weeks.   Time spent coordinating care for discharge and discharge instructions  equals 40 minutes.      Hillery Aldo, M.D.  Electronically Signed     CR/MEDQ  D:  12/10/2008  T:  12/10/2008  Job:  [id number]   cc:   Aida Puffer  Fax: [phone]   Areatha Keas, M.D.  Fax: [phone]

## 2011-02-13 NOTE — Consult Note (Signed)
[name], [name]                ACCOUNT NO.:  [id number]   MEDICAL RECORD NO.:  [id number]          PATIENT TYPE:  INP   LOCATION:  1233                         FACILITY:  [facility]   PHYSICIAN:  Lamarr Lulas, MD   DATE OF BIRTH:  05/23/1946   DATE OF CONSULTATION:  12/07/2008  DATE OF DISCHARGE:                                 CONSULTATION   CHIEF COMPLAINT:  Evaluate for dacryocystitis.   HISTORY OF PRESENT ILLNESS:  The patient is a 65 year old white male  with a past medical history of coronary artery disease, hypertension,  psoriasis, and psoriatic arthritis, who was admitted on December 04, 2008  with a severe left facial cellulitis with SIRS. The patient reports that  Saturday morning, he woke up with significant facial swelling and  periorbital swelling on the left with some facial swelling on the right.  He reports that prior to this, he had no tearing or discharge from his  eyes. The patient came to the hospital where he was admitted to the  Intensive Care Unit and started on Zosyn and Vancomycin. Since that  time, the cellulitis has significantly improved, as has the periorbital  edema. Presently, the patient says that he has a little bit of soreness  around the left eye but that his vision is good.   PAST OCULAR HISTORY:  Wears glasses. No surgery or injury.   PAST MEDICAL HISTORY:  1. Coronary artery disease status post coronary artery bypass grafting      x4.  2. Hypertension.  3. Psoriasis and psoriatic arthritis.   OUTPATIENT MEDICATIONS:  Aspirin, Enbrel, fish oil, folic acid,  Metoprolol.   ALLERGIES:  NO KNOWN DRUG ALLERGIES.   SOCIAL HISTORY:  Negative for smoking and alcohol.   FAMILY OCULAR HISTORY:  He has a mother with a history of Fuchs  dystrophy, now status post corneal transplant.   PHYSICAL EXAMINATION:  VISUAL ACUITY:  Near with correction:  Right eye  20/25, left eye 20/25-1.  EXTRAOCULAR MOTILITY:  Full versions. Orthophoric. Pupils:  Equal,  round  and reactive to light, under room lighting conditions. TONOMETRY:  deferred.  EXTERNAL EXAMINATION:  There is some hyperpigmentation of the left upper  and lower eyelids and maxillary area with significant crusting of the  skin. Many attempts were made to express discharge from the lacrimal sac  on both the left and the right and no discharge was seen. No fullness of  the lacrimal sac could be appreciated.  SPLIT LAMP EXAMINATION:  Conjunctiva: quiet OU.  CORNEA:  Clear OU.  ANTERIOR CHAMBER:  Deep and quiet OU.  IRIS:  Normal OU.  LENS:  Nuclear sclerosis OU.   IMPRESSION/PLAN:  Resolving left facial cellulitis. It is unlikely that  the lacrimal sac is the source of his infection.  There is currently no  evidence on examination of dacryocystitis. The patient also denies any  history of symptoms suggestive of nasolacrimal duct obstruction, which  could have predisposed him to dacryocystitis. I would recommend  continuation of antibiotic therapy and outpatient followup in my office  for probing and irrigation of his  lacrimal drainage system to evaluate  for any signs of obstruction. This is best performed when any acute  infection has resolved. Ophthalmic ointment such as Refresh PM could be  applied to the periocular skin for lubrication. Erythromycin ophthalmic  is another alternative. The impression and plan were discussed with the  patient and family.      Lamarr Lulas, MD  Electronically Signed     MCT/MEDQ  D:  12/07/2008  T:  12/07/2008  Job:  [id number]   cc:   Lonia Blood, M.D.

## 2011-02-13 NOTE — H&P (Signed)
[name], PONDER NO.:  [id number]   MEDICAL RECORD NO.:  [id number]          PATIENT TYPE:  EMS   LOCATION:  ED                           FACILITY:  [facility]   PHYSICIAN:  Pedro Earls, MD     DATE OF BIRTH:  [birth year]   DATE OF ADMISSION:  12/04/2008  DATE OF DISCHARGE:                              HISTORY & PHYSICAL   PRIMARY CARE PHYSICIAN:  Aida Puffer, MD.   CHIEF COMPLAINT:  Fever and facial swelling.   HISTORY OF PRESENT ILLNESS:  This is a 65 year old white male patient  with a past medical history significant for coronary artery disease  status post CABG x4 and a history of psoriatic arthritis, who was  apparently asymptomatic until yesterday when the patient started  complaining of weakness and swelling and fever.  According to the  patient, he was fine until yesterday when he started feeling weak and  felt like he had no strength or energy.  The patient had gone to his bed  where he was still feeling warm.  When patient's wife took the  temperature, it was 101.8.  The patient stated that the whole night felt  tightening of his face and also had episodes of vomiting x2.  The  patient continued to feel warm and thirsty and this morning when the  patient woke up, the patient had swelling of the left side of his face  as well as the fever of 102.5.  The patient's primary care physician was  contacted and due to patient's blood pressure being 70 at home systolic,  patient was advised to come to the emergency room for further evaluation  and management.   Patient was complaining of loss of appetite, difficulty swallowing, pain  and burning in his face, nausea and vomiting, lethargy.   REVIEW OF SYSTEMS:  As above.  The rest of the review of systems were  negative.   PAST MEDICAL HISTORY:  1. Coronary artery disease status post CABG x4.  2. Hypertension.  3. Psoriasis and psoriatic arthritis.   MEDICATIONS:  1. Aspirin 81 daily.  2. Enbrel 1  shot subcu weekly.  3. Fish oil 1 tab daily.  4. Folic acid 1 tab daily.  5. Metoprolol 25 mg daily.   ALLERGIES:  NKDA.   SOCIAL HISTORY:  Negative for smoking, alcohol, or IV drug abuse.   FAMILY HISTORY:  Positive for diabetes and congestive heart failure in  patient's mother, and prostatic problem in patient's father.   PHYSICAL EXAMINATION:  VITAL SIGNS:  T-max was 102.8, T-current is  102.3.  Blood pressure at home was 70 systolic, here in the ER is 90/53,  and most recent is 115/65.  Pulse 80s, respirations 15, pulse-ox 94 to  100% room air.  GENERAL:  The patient awake, alert, and oriented x3, does appear to be  some distress secondary to the burning and the swelling and the pain in  his face.  HEENT:  Pupils equal, round, reactive to light, no icterus, no pallor,  extraocular movements are intact, oral mucosa is dry.  Patient has an  extensive swelling on his left side of the face, which crosses toward  the right side of the face especially at the bridge of the nose.  Also,  there is a tiny boil present without any discharge or exudate at the  bridge of the nose as well, which is fading to the left side.  NECK:  Supple, no JVD, no lymphadenopathy.  CVS:  S1 S2 regular.  CHEST:  Clear.  ABDOMEN:  Soft and nontender, bowel sounds present, no  hepatosplenomegaly.  EXTREMITIES:  Pulses are present, no cyanosis, clubbing, or edema.  CNS:  Sensory and motor are grossly intact.  Cranial nerves II-XII are  intact.  SKIN:  As above.  MUSCULOSKELETAL:  Unremarkable.   Patient's CT scan showed a left facial cellulitis.   Sodium is 130, AST is 71, ALT is 36, patient's total bilirubin is 4.4,  which is high, white count is 9.9, platelet count is 71, patient does  have a left shift.  UA was negative.   IMPRESSION:  1. Left facial cellulitis with SIRS.  2. Hypotension.  3. Dehydration.  4. Coronary artery disease.  5. Thrombocytopenia.  6. Abnormal liver function tests.    PLAN:  1. Admit to [facility].  2. Continue Zosyn and Vancomycin IV for now; pharmacy to dose.  3. Continue IV fluids since patient appeared to be dehydrated and      hypotensive.  4. Hold patient's Enbrel for now and resume other medications.  5. Check CBC in the a.m. as well as LFTs.      Pedro Earls, MD  Electronically Signed    NS/MEDQ  D:  12/04/2008  T:  12/04/2008  Job:  [id number]   cc:   Aida Puffer  Fax: [phone]

## 2011-02-14 LAB — CULTURE, BLOOD (ROUTINE X 2)
Culture  Setup Time: 201205101346
Culture  Setup Time: 201205101346
Culture: NO GROWTH

## 2011-02-16 NOTE — Discharge Summary (Signed)
NAMELEONA, PRESSLY                ACCOUNT NO.:  [id number]   MEDICAL RECORD NO.:  [id number]          PATIENT TYPE:  INP   LOCATION:  1413                         FACILITY:  [facility]   PHYSICIAN:  Althea Grimmer. Santogade, M.D.DATE OF BIRTH:  [birth year]   DATE OF ADMISSION:  04/21/2005  DATE OF DISCHARGE:                                 DISCHARGE SUMMARY   ADDENDUM  The patient had two episodes of sepsis in the last 6 weeks, originally with  Streptococcus pyogenes and secondly with Klebsiella. He was discovered  surprisingly on ERCP to have a mid common bile duct stone which had not been  visualized on any imaging procedure. He underwent ERCP with sphincterotomy  and stone extraction and his liver function tests continued to improve with  his bilirubin being 5.7 on discharge and his alkaline phosphatase 471. He  had no further fevers or chills. He will be discharged in a greatly improved  condition to be seen in my office within 10 days.       PJS/MEDQ  D:  04/27/2005  T:  04/27/2005  Job:  [id number]   cc:   Areatha Keas, M.D.  [address]  [address]  [place]  [place] [address]  Fax: [phone]   Antionette Char, MD  [address]  [place]  [place] [address]  Fax: [phone]

## 2011-02-16 NOTE — Discharge Summary (Signed)
[name], [name]                ACCOUNT NO.:  [id number]   MEDICAL RECORD NO.:  [id number]          PATIENT TYPE:  INP   LOCATION:  1413                         FACILITY:  [facility]   PHYSICIAN:  Mallory Shirk, MD     DATE OF BIRTH:  November 05, 1945   DATE OF ADMISSION:  04/21/2005  DATE OF DISCHARGE:                                 DISCHARGE SUMMARY   DISCHARGE DIAGNOSES:  1.  Septic shock.  2.  Cholestatic jaundice.  3.  Pancreatitis.  4.  Anemia.  5.  Diabetes mellitus.  6.  Coronary artery disease.   DISCHARGE MEDICATIONS:  1.  Aspirin  81 mg p.o. daily.  2.  Lasix 40 mg p.o. daily.  3.  Metoprolol 25 mg p.o. b.i.d.  4.  Protonix 40 mg p.o. daily.  5.  Mylanta 80 mg p.o. q.i.d. p.r.n. bloating.   FOLLOW UP APPOINTMENTS:  1.  Dr. Phylliss Bob, primary care physician, within five to seven days of      discharge.  2.  Dr. Roosvelt Harps, gastroenterology.  Patient will call Dr.      Joanette Gula office for an appointment.  3.  Dr. Charolette Child, cardiology.  Patient will call Dr. Aleen Campi for an      appointment.   HISTORY OF PRESENT ILLNESS:  [name] is a very pleasant 65 year old  Caucasian gentleman with a history of hepatic disease, cholestatic jaundice  who presented to the emergency department in [facility] on April 21, 2005, with complaints of fevers.  Patient was discharged recently from  [facility] on April 14, 2005, after being admitted for epistaxis  which was addressed by Dr. Lazarus Salines and after the patient was discharged,  patient underwent catheterization.  Patient's wife states that since the  discharge from the hospital, patient had been a little weak, his jaundice  had not resolved. Starting the day prior to admission patient had some  abdominal pain, was complaining of nausea without vomiting.  Patient also  had a high temperature and was brought into the ED.  Upon arrival in the ED,  his temperature was 104.4.  He was given ibuprofen 800 p.o.  after which his  temperature was still 104.2.  Patient's systolic blood pressure in the ED  steadily dropped and at one point was 72 systolic.  IV fluids were started  with normal saline wide open and patient was directly transferred to the ICU  and dopamine was started.   PAST MEDICAL HISTORY:  1.  Strep pyogenes bacteremia.  2.  Septic shock in June.  3.  Non-ST segment elevation MI.  4.  Multiorgan failure during hospitalization in June including ventilator      dependent respiratory failure, acute renal failure needing CBD/HD.  5.  Jaundice with elevated transaminases.  6.  Hepatitis C infection.  7.  Left lower extremity cellulitis.  8.  Coronary artery disease, status post CABG in 2005.  9.  GERD.  10. Hyperlipidemia.  11. Psoriatic arthritis.  12. Diet controlled diabetes mellitus.   PAST SURGICAL HISTORY:  1.  Cholecystectomy.  2.  Appendectomy.  3.  Repair of left femoral fracture.  4.  CABG three vessel in January of 2005.   MEDICATIONS ON ADMISSION:  1.  Lopressor 25 mg p.o. b.i.d.  2.  Aldactone 25 mg p.o. daily.  3.  Furosemide 20 mg p.o. daily.  4.  Nu-Iron 150 mg p.o. daily.  5.  Spironolactone 50 mg p.o. daily.   ALLERGIES:  NO KNOWN DRUG ALLERGIES.   PHYSICAL EXAMINATION ON ADMISSION:  GENERAL APPEARANCE:  A very pleasant,  elderly Caucasian gentleman lying in bed in no acute distress.  Alert and  oriented x3.  Wife and son at the bedside.  VITAL SIGNS:  Blood pressure 119/72 which subsequently dropped to systolic  70, pulse 106, respirations 24, temperature 104.2.  HEENT:  Normocephalic and atraumatic.  PERRL.  Sclerae are anicteric.  Moist  mucous membranes.  Oropharynx nonerythematous.  NECK:  Supple.  No LAD, no JVD.  LUNGS:  Clear to auscultation bilaterally, no wheezes, no rales.  CARDIOVASCULAR:  S1 plus S2, tachycardic, no murmurs, rubs, or gallops.  ABDOMEN:  Soft, positive bowel sounds, no tenderness, no masses.  No fluid  thrill.  GU:  Tinea  cruris noted in the inguinal areas.  No scrotal edema.  EXTREMITIES:  No clubbing, cyanosis, or edema.  SKIN:  Jaundiced all over the body.  Stage I ulcer on sacrum secondary to  psoriasis.  NEUROLOGIC:  Nonfocal.   LABORATORY DATA:  Chest x-ray showed bibasilar atelectasis, no active  disease.   WBC 8.8, hemoglobin 10.8, hematocrit 30.6, platelets 240.  Amylase 102,  lipase 89, sodium 125, potassium 4.2, chloride 97, carbon dioxide 20,  glucose 92, BUN 19, creatinine 1.3, calcium 7.8.  Albumin 1.4, AST less than  5, ALT 102, alkaline phosphatase 687, total bilirubin 10.3.   HOSPITAL COURSE:  Patient was admitted to the intensive care unit.   PROBLEM #1 -  SEPTIC SHOCK:  Patient's blood pressure was maintained with  dopamine drip.  He was given Rocephin and clindamycin.  He was also given  vancomycin empirically.  Blood cultures were drawn.  Dr. Francella Solian, critical  care physician, saw the patient in the ICU.  A triple lumen catheter was  placed.  Patient was fluid resuscitated.   Patient responded well to these measures.  Blood cultures showed gram  negative rods, subsequently identified as Klebsiella pneumoniae sensitive to  ceftriaxone.  Vancomycin was discontinued.  After two days, patient was off  the dopamine.  His temperature had come down.  He was alert and oriented and  subsequently did not need dopamine to maintain his blood pressure.   PROBLEM #2 -  CHOLESTATIC JAUNDICE:  Patient's total bilirubin upon  admission was 10.3.  This steadily went down with hydration.  On April 25, 2005, Dr. Roosvelt Harps, GI, did an endoscopic retrograde  cholangiopancreatography (ERCP) with sphincterotomy and a stone extraction.  A moderately large common bile duct pigmentary stone was removed.  Dr.  Luther Parody believed this biliary stone was probably responsible for his  recurrent episodes of sepsis.  After this procedure, which the patient tolerated well, he was started on a clear diet  which has been advanced to a  regular diet.  At the time of this dictation, patient is sitting up eating a  regular diet with no nausea or vomiting.  His jaundice is resolving.  The  color on his skin is now back to normal.  Some icteric sclera still seen.   PROBLEM #3 -  PANCREATITIS:  Patient's amylase and lipase had been elevated  which subsequently had now trended back to normal.   PROBLEM #4 -  ANEMIA:  Patient's H&H on admission was 10.8/30.6.  Subsequently on April 22, 2005, his H&H was down to 8.5/24.3 with  leukocytosis as well.  He was transfused two units of PRBC.  At the time of  this dictation, patient's H&H is 10.8/30.4.  There were no episodes of  active bleeding.   PROBLEM #5 -  DIABETES MELLITUS:  Patient has diet controlled diabetes  mellitus.  He was put on a sliding scale insulin.  His blood sugars were  well controlled during the hospital stay.   PROBLEM #6 -  CORONARY ARTERY DISEASE:  Patient had mild elevation of  cardiac enzymes following  his septic episode.  He was seen by Dr. Aleen Campi,  primary cardiologist.  No further intervention was planned at this  hospitalization.  Patient will be seen by Dr. Aleen Campi on an outpatient as  scheduled before.   DISPOSITION:  At the time of this dictation, patient is ambulatory, alert  and oriented x3, tolerating a regular diet.  In one to two days, if there  are no further complications, patient will be ready for discharge.  Essentially, when GI okays his discharge, patient should be ready to go as  long as his vitals are stable, blood pressure is stable.  Patient was also  seen by nutrition since his albumin is 1.6.       GDK/MEDQ  D:  04/26/2005  T:  04/26/2005  Job:  [id number]   cc:   Althea Grimmer. Luther Parody, M.D.  [address]  [place]  [place] [address]  Fax: [phone]   Bernette Redbird, M.D.  [address], [address]  [place], [place] [address]  Fax: [phone]   Areatha Keas, M.D.  [address]   [address]  [place]  [place] [address]  Fax: [phone]   Gloris Manchester. Lazarus Salines, M.D.  [address] [place]  [place] [address]  Fax: [phone]

## 2011-02-16 NOTE — Consult Note (Signed)
[name], [name] NO.:  [id number]   MEDICAL RECORD NO.:  [id number]          PATIENT TYPE:  INP   LOCATION:  2303                         FACILITY:  [facility]   PHYSICIAN:  Maree Krabbe, M.D.DATE OF BIRTH:  04-26-1946   DATE OF CONSULTATION:  DATE OF DISCHARGE:                                   CONSULTATION   This is a 65 year old male with a history of CABG in 2005 admitted with  septic shock of uncertain cause.  He was in his usual state of health until  yesterday, June 2, when he developed atypical left upper chest pain and  shaking chills and fever.  He had vomited three times, came to the emergency  room, and was found to be hypotensive with respiratory distress.  Chest x-  ray and chest CT were negative, but the patient required intubation and  admission to the intensive care unit.  He is currently quite vasopressor-  dependent with metabolic acidosis, pH 7.25, in spite of 6 amps of sodium  bicarbonate.  He has a left shift on his differential, white blood count  11,000 with greater than 20% bands.  EKG unremarkable.  A urinalysis has not  been done that I can see.   PAST MEDICAL HISTORY:  1.  CAD with CABG in January 2005.  2.  Obesity.  3.  Hyperlipidemia.  4.  Psoriatic arthritis.  5.  GERD.  6.  History of appendectomy and cholecystectomy.  7.  Left femur in the past requiring multiple surgeries and revisions.  8.  History of abnormal LFTs in response to methotrexate.  9.  Diet-controlled diabetes mellitus type 2.   ALLERGIES:  None.   MEDICATIONS ON ADMISSION:  Toprol, Plavix, Naprosyn, Lipitor, folic acid,  and Prilosec.   SOCIAL HISTORY:  The patient lives with his wife in [place].  Distant tobacco  use, occasional cigars.  Occasional alcohol.   FAMILY HISTORY:  Negative.   REVIEW OF SYSTEMS:  Not obtainable.   PHYSICAL EXAMINATION:  VITAL SIGNS:  Temperature 99.8, blood pressure 90/40  on 30 mcg of Levophed, 50% oxygen with 95%  saturation, PA diastolic is  between 20 and 25, cardiac output 4.5, cardiac index 2.0, SVR 900.  Drips  are fentanyl, Versed, Levophed and bicarb drip at 50.  Urine output is 20  mL/hr.  GENERAL:  The patient is intubated and sedated, a middle-aged white male.  Appears well-developed, slightly obese.  SKIN:  Without rash.  HEENT:  Conjunctivae are pink, sclerae anicteric.  CHEST:  Clear to auscultation throughout.  CARDIAC:  Regular rate and rhythm without murmur, rub, or gallop.  ABDOMEN:  Soft, nontender, nondistended, with active bowel sounds.  EXTREMITIES:  Trace edema in the lower extremities, good pulses femoral and  in the feet.  No distal cyanosis or gangrene or ulceration.  NEUROLOGIC:  Moving all extremities slightly under sedation.   LABORATORY DATA:  Hemoglobin 14, white blood count 11%, platelets 167.  INR  1.9.  Sodium 138, potassium 3.8, CO2 11, chloride 112, BUN 23, creatinine  1.6, calcium 6.4.  CBG 87.  Chest x-ray negative.   ADDENDUM:  I forgot to mention his temperature, 100.4, up with the vital  signs.   IMPRESSION:  1.  Acute sepsis with shock and severe metabolic acidosis of uncertain      cause.  Need to check urine.  2.  Early acute renal failure with oliguria.  3.  History of diabetes mellitus type 2, diet-controlled.  4.  History of coronary artery disease, status post coronary artery bypass      graft in 2005.  5.  Mild hypokalemia.   RECOMMENDATIONS:  1.  Urinalysis and culture.  2.  CVVHD for refractory and severe metabolic acidosis in the setting of      hypotension and early acute renal failure.   Discussed with family.       RDS/MEDQ  D:  03/04/2005  T:  03/05/2005  Job:  [id number]

## 2011-02-16 NOTE — Consult Note (Signed)
NAMETYQUARIUS, PAGLIA NO.:  [id number]   MEDICAL RECORD NO.:  [id number]          PATIENT TYPE:  INP   LOCATION:  2303                         FACILITY:  [facility]   PHYSICIAN:  Althea Grimmer. Santogade, M.D.DATE OF BIRTH:  03/23/1946   DATE OF CONSULTATION:  03/11/2005  DATE OF DISCHARGE:                                   CONSULTATION   [name] is a 65 year old male who was admitted to the hospital on June 4  in septic shock.  Subsequently it was discovered that he had group A strep  in his bloodstream.  The source of his infection is unclear but possibly  related to psoriatic skin infection.  With his septic shock, he had acute  renal failure and required pressors in the ICU.  I am asked to see him for  rising bilirubin which is currently 15.6.  His other liver function tests  reveal an alkaline phosphatase of 377, SGOT of 231, SGPT of 114.  His  albumin is 1.4.  Admission prothrombin time was 18.7 with an INR of 1.9.  Currently his prothrombin time is 21.6 with an INR of 2.5.  The patient has  prior history of mild liver disease by report.  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  He had  been on methotrexate for psoriatic arthritis at that time.  He has not been  on methotrexate since 2005.  By report, he also had a previous liver biopsy.  His wife tells me that he was told at one point that he had hepatitis C  antibody test is positive, but the PCR is indeterminate and does not confirm  active hepatitis C.  His hepatitis B core antibody test is negative, and his  hepatitis A antibody is negative.   Other gastrointestinal evaluation includes colonoscopy done for screening  purposes in March 2004 that was negative except for internal hemorrhoids,  and an upper endoscopy at the same time which showed a small hiatal hernia  with distal esophagitis and a questionable early stricture.  Biopsies were  negative for  Barrett's.   Currently the patient is very weak but alert, oriented, and conversant.  He  complains of some right upper quadrant pressure.  His abdomen feels tight,  and he also has discomfort in the left shoulder.   PAST MEDICAL HISTORY:  1.  Coronary artery bypass graft in 2005.  2.  Obesity.  3.  Hyperlipidemia.  4.  Psoriasis and psoriatic arthritis.  5.  GERD and hiatal hernia.  6.  Status post appendectomy.  7.  Status post cholecystectomy.  8.  Status post left femur fracture with multiple surgical repairs.  9.  Diabetes mellitus, type 2.   CURRENT MEDICATIONS IN HOSPITAL:  1.  Aspirin 325 mg daily.  2.  Albuterol.  3.  Atrovent.  4.  Protonix b.i.d.  5.  Sliding scale insulin.  6.  Vancomycin 1 g IV q.12h.  7.  Lopressor 25 mg q.12h.  8.  Lovenox.  9.  KCl.   ALLERGIES:  None reported.   FAMILY HISTORY:  Noncontributory.   SOCIAL HISTORY:  Nonsmoker, occasional alcohol user, occasional cigar  smoker.   REVIEW OF SYSTEMS:  As noted above.   PHYSICAL EXAMINATION:  GENERAL:  He is icteric and weak, in no acute  distress.  VITAL SIGNS:  Temperature 99.1, blood pressure 130/70, pulse 96.  SKIN:  Jaundiced with scattered patches of psoriasis.  HEENT:  Eyes are icteric.  Oropharynx unremarkable.  CHEST:  Sounds clear anteriorly.  HEART SOUNDS:  Regular rate and rhythm.  ABDOMEN:  Distended, and there is obvious ascites present.  I do not feel  the liver.  There is minimal right upper quadrant tenderness.  RECTAL:  Exam not performed.  EXTREMITIES:  Reveal trace edema.   ADDITIONAL LABORATORY TESTS:  Hemoglobin 10.8, white blood count 20.4,  platelet count 89.  Electrolytes are normal; BUN 26, creatinine 0.9.   Ultrasonogram demonstrates an enlarged spleen.  He is status post  cholecystectomy.  Common bile duct is normal.  Ascites is seen.   On CT scan, he has ascites and an unremarkable abdomen.  This study was done  on June 4.   IMPRESSION:  A 65 year old  male with group A Streptococcus sepsis of unclear  etiology, possibly secondary to psoriasis inflammation.  It is also possible  that he has had more significant ongoing liver disease undetected.  This  could be multifactorial related to hepatitis C, fatty liver, and  methotrexate in the past.  It is possible that he has undetected ascites and  that the group A Streptococcus is from spontaneous bacterial peritonitis.   At any rate, at the current moment he seems to have quite significant  ascites which he will be very unlikely to mobilize successfully with an  albumin of 1.4.  I do not know if he has active hepatitis C infection due to  the indeterminate PCR which must be repeated.  It is most likely that his  elevated bilirubin is related to the recent sepsis and hypotension and that  he is hemolyzing and having difficulty excreting bilirubin due to the stress  of the current illness in an already compromised liver.  I doubt that there  is new pathology going on at this point.   RECOMMENDATIONS:  1.  Minimize fluid and consider increased diuresis, following his BUN and      creatinine carefully.  2.  I would possibly consider giving him intravenous albumin to try and      mobilize some of his fluid.  3.  Alternatively, an ultrasound-guided paracentesis could be carried out.  4.  A quantitative PCR for hepatitis C is again ordered.  5.  I would recommend great caution in giving him both aspirin and Lovenox      with his prothrombin time already      elevated to 21.6.  He is at great risk for hemorrhaging, and I would      probably recommend discontinuing one of those agents at this point,      likely the aspirin.  6.  I will follow along and make further recommendations as his course      dictates.       PJS/MEDQ  D:  03/11/2005  T:  03/11/2005  Job:  [id number]   cc:   Dr.  Garnette Czech, [facility]

## 2011-02-16 NOTE — Op Note (Signed)
NAME:  [name], [name]                          ACCOUNT NO.:  [id number]   MEDICAL RECORD NO.:  [id number]                   PATIENT TYPE:  INP   LOCATION:  2302                                 FACILITY:  [facility]   PHYSICIAN:  Salvatore Decent. Cornelius Moras, M.D.              DATE OF BIRTH:  1945/11/07   DATE OF PROCEDURE:  10/26/2003  DATE OF DISCHARGE:                                 OPERATIVE REPORT   PREOPERATIVE DIAGNOSIS:  Left main coronary artery disease, 3-vessel  coronary artery disease, class 4 unstable angina.   POSTOPERATIVE DIAGNOSIS:  Left main coronary artery disease, 3-vessel  coronary artery disease, class 4 unstable angina.   PROCEDURE:  Median sternotomy for coronary artery bypass grafting  x4 with  coronary artery endarterectomy x1 (left internal mammary artery to the  distal left anterior descending coronary artery with coronary  endarterectomy, saphenous vein graft to ramus intermediate branch, saphenous  vein graft to the posterior descending coronary artery, left radial artery  to 2nd circumflex marginal branch, endoscopic saphenous vein harvest from  the right thigh).   SURGEON:  Salvatore Decent. Cornelius Moras, M.D.   ASSISTANT:  Toribio Harbour, N.P.   ANESTHESIA:  General.   INDICATIONS FOR PROCEDURE:  The patient is a 65 year old  white male with no  previous cardiac  history but risk factors including  a history of obesity,  hyperlipidemia, family history of coronary artery disease and newly  diagnosed type 2 diabetes mellitus. He describes symptoms consistent with  class 2 exertional angina. He was referred to Dr. Aleen Campi, and subsequently  had an abnormal stress Cardiolite examination  performed on October 19, 2003. Follow up cardiac catheterization demonstrated 70% left main coronary  artery stenosis with severe  3-vessel coronary artery disease and preserved  left ventricular function. A full consultation note has been dictated  previously.   On the day prior to  surgery, the patient presented to the emergency room  with symptoms of prolonged chest pain occurring at rest. He ruled out for  acute myocardial infarction, but he was admitted to the hospital  but placed  on intravenous nitroglycerin and heparin. He is now brought to the operating  room for urgent surgical revascularization.   Operative consent:  The patient and his family  have been counseled at  length regarding the indications, risks, and potential benefits of coronary  artery bypass grafting. Alternative treatment strategies have been  discussed. They understand and accept all associated risks of surgery  including but not limited to risks of death, stroke, myocardial infarction,  congestive heart failure, respiratory failure, pneumonia, bleeding requiring  blood transfusion, arrhythmia, infection and recurrent coronary artery  disease. All of their questions have been addressed.   DESCRIPTION OF PROCEDURE:  The patient was brought to the operating room on  the above mentioned date and central monitoring is established by the  anesthesia service under the care and direction of  Dr. Adonis Huguenin.  Specifically a Swann-Ganz catheter is placed through the right internal  jugular approach. A radial arterial line is placed. Intravenous antibiotics  are administered.   The patient's left hand is carefully  examined and a pulse oximetry probe is  placed on his left index finger. The radial pulse is briefly occluded and  the pulse oximetry wave form is noted to stay stable without significant  change despite occlusion of the radial pulse.  A Foley catheter is placed. The patient's chest, abdomen, both groins and  both lower extremities are prepped and draped in a sterile manner.   A median sternotomy incision is performed and the left internal mammary  artery is dissected from the chest wall and prepared for bypass grafting.  The left internal mammary artery is a good quality conduit.  Following  this,  the left radial artery is harvested through a longitudinal incision along  the volar aspect of the left forearm. The left radial artery is a good  quality conduit. All intervening branches of the artery and its associated  veins are divided using the ultrasonic scalpel. Care was taken during the  dissection to avoid injury to the superficial thenar branch of the left  radial nerve. The artery is mobilized all the way from the takeoff of the  large 1st interosseous perforating branch just after the antecubital fossa  to just above the wrist. The patient was heparinized systemically.   The distal end of the radial artery is occluded and the distal stump is  doubly oversewn with suture  ligatures after transection of the radial  artery. The proximal  end is then transected and the radial conduit is  placed in a small container with the patient's heparinized blood with a  small  amount of papaverine solution for storage. The proximal end of the  radial artery stump is doubly oversewn with suture ligatures. The forearm  incision is irrigated with saline solution, inspected for hemostasis and  subsequently closed using running closure of 0 double suture.   Simultaneously, saphenous vein is obtained from the patient's right thigh  using endoscopic vein harvest technique. There was a large varicosity in the  midportion of this vein, and subsequently an additional segment of saphenous  vein is obtained using open technique  through a longitudinal incision just  below the knee on the right side. The saphenous vein which is utilized for  conduit during the case is felt to be satisfactory. The left radial artery  conduit is excellent.   The pericardium is opened. The ascending aorta is normal in appearance. The  ascending aorta and the right atrium are cannulated for cardiopulmonary  bypass. A retrograde cardioplegia catheter is placed through the right atrium into the coronary  sinus. Adequate heparinization is verified.  Cardiopulmonary bypass is begun and the surface of the heart  is inspected.   There is some diffuse scarring in the distal anterior wall in the apex,  consistent with old myocardial infarction. There is very diffuse distal  coronary artery disease in all of the epicardial vessels. Portions of  saphenous vein, the left radial artery and the left internal mammary artery  are all trimmed to appropriate lengths. A temperature probe is placed in the  left ventricular septum. A cardioplegia catheter is placed in the ascending  aorta.   The patient is cooled to 32 degrees systemic temperature. The aortic cross  clamp is applied and cardioplegia is delivered  initially in an antegrade  fashion  through the aortic root. Iced saline slush is applied for topical  hypothermia. The initial cardioplegic arrest and myocardial cooling are felt  to be satisfactory. Supplemental cardioplegia is administered retrograde  through the coronary sinus catheter and myocardial cooling is notably  excellent. Repeat doses of cardioplegia are administered intermittently  through the cross clamp portion of the operation, both through the aortic  root and down the subsequently placed vein grafts and retrograde through the  coronary sinus catheter to maintain septal temperature below 15 degrees  centigrade.   The following  distal  coronary anastomoses are performed:  1. The posterior descending coronary artery is grafted with a saphenous vein     graft in an end-to-side fashion. This coronary measures 1.5 mm in     diameter and is of fair to poor quality. It is diffusely diseased.  2. The 2nd circumflex marginal branch is grafted with the left radial artery     in an end-to-side fashion. This coronary measures 1.5 mm at the site of     the distal bypass and is of good quality. Proximally  the vessel is     diffusely diseased, but distally it is fairly normal.  3. The ramus  intermediate branch is grafted with a saphenous vein graft in     an end-to-side fashion. This coronary measures 2.0 mm in diameter and is     of good quality at the site of the distal bypass.  4. The distal left anterior descending coronary artery is grafted with a     left internal mammary artery in an end-to-side fashion. This coronary is     diffusely diseased with severe  plaque throughout.   Ultimately a site for arteriotomy was chosen, but after arteriotomy was  performed a 1.0 probe will not pass in either  direction. No other suitable  sites for grafting are notably identified. Subsequently, carotid artery  endarterectomy is performed using standard eversion technique  to evaluate  the diffuse plaque throughout this vessel.   A long arteriotomy is performed in order to facilitate endarterectomy  performance. At the distal  end there is a short segment of artery lumen  that is more normal to facilitate the distal  end of the distal anastomosis. Portions of plaque from the endarterectomy specimen are sent to  pathology for  identification. The left internal mammary artery is then sewn  in an end-to-side fashion to the left anterior descending coronary artery  with a very long arteriotomy and patch angioplasty closure.   All 3 proximal anastomoses are performed directly to the ascending aorta  prior to removal  of the aortic cross clamp. The proximal  end of the left  radial artery is sewn directly to the ascending aorta as are both of the  saphenous vein grafts.   The septal temperature is noted to rise rapidly upon reperfusion of the left  internal mammary artery. One final dose of warm retrograde hot shot  cardioplegia is administered. The patient is placed in the Trendelenburg  position and all air is evacuated from the aortic root.  The aortic cross  clamp  is removed after total cross clamp  time of 121 minutes.   The heart begins to beat spontaneously without need for  cardioversion. All  proximal and distal anastomoses were inspected for hemostasis and  appropriate graft  orientation. Epicardial pacing wires are affixed to the  right ventricular outflow tract into the right atrial appendage. The patient  was rewarmed to  37 degrees centigrade temperature.   The patient was weaned from cardiopulmonary bypass without difficulty. The  patient's rhythm at separation from bypass is normal sinus rhythm. No  inotropic support was required. Total cardiopulmonary bypass time for the  operation was 142 minutes.   The venous and arterial cannulae were removed uneventfully as is the  retrograde cardioplegia catheter. The mediastinum and the left chest are  irrigated with saline solution containing vancomycin. Meticulous surgical  hemostasis is ascertained. The mediastinum and the left chest are drained  with 3 chest tubes placed thorough separate stab incisions inferiorly.   The median sternotomy was closed in a routine fashion using double strength  sternal wire. The right lower extremity incisions are closed in multiple  layers in a routine fashion. All skin incisions are closed with subcuticular  skin closures.   The patient tolerated the procedure well. He was transported to the surgical  intensive care unit in stable condition. There were no intraoperative  complications. All sponge, instrument and needle counts were verified  correct at the completion of the operation. No blood products were  administered.   Of note, the patient has diffuse distal  coronary artery disease and  probably should not  be considered a candidate for redo bypass grafting in  the future.                                               Salvatore Decent. Cornelius Moras, M.D.    CHO/MEDQ  D:  10/26/2003  T:  10/26/2003  Job:  [id number]   cc:   Aram Candela. Aleen Campi, M.D.  [address]  [place]  [place] [address]  Fax: [phone]

## 2011-02-16 NOTE — Discharge Summary (Signed)
NAME:  [name], [name]                          ACCOUNT NO.:  [id number]   MEDICAL RECORD NO.:  [id number]                   PATIENT TYPE:  INP   LOCATION:  2021                                 FACILITY:  [facility]   PHYSICIAN:  Salvatore Decent. Cornelius Moras, M.D.              DATE OF BIRTH:  08-28-1946   DATE OF ADMISSION:  10/25/2003  DATE OF DISCHARGE:  11/02/2003                                 DISCHARGE SUMMARY   ADMITTING DIAGNOSIS:  Severe three-vessel coronary artery disease.   PAST MEDICAL HISTORY:  1. Psoriatic arthritis.  2. Gastroesophageal reflux disease.  3. Recent elevated liver function tests obtained following use of     methotrexate.  4. Hyperlipidemia.  5. Obesity.   PAST SURGICAL HISTORY:  Surgical history includes:  1. Appendectomy.  2. Cholecystectomy.  3. Left femur fracture x2 with multiple surgical procedures and revisions.   ALLERGIES:  This patient is not allergic to any medications.   DISCHARGE DIAGNOSIS:  Severe three-vessel coronary artery disease, status  post coronary artery bypass graft.   BRIEF HISTORY:  [name] is a 65 year old Caucasian male.  He is followed  by Dr. Areatha Keas for management of his psoriatic arthritis.  At the time of  his most recent routine annual physical, he reported longstanding history of  stable symptoms of exertional shortness of breath with mild exertional  substernal chest tightness.  He was referred for elective cardiac evaluation  to include a stress Cardiolite exam.  He was seen by Dr. Aram Candela. Tysinger  and underwent a stress Cardiolite study on October 19, 2003.  The test was  notable for a resting ejection fraction of 60% with evidence of ischemia  involving the apex, anterior and septal wall and the posterior walls.  Dr.  Aleen Campi recommended cardiac catheterization and the patient agreed.  This  was performed on October 21, 2003 and revealed a 70% left main coronary  stenosis and severe three-vessel coronary artery  disease with preserved left  ventricular function.  Cardiac surgical consultation was requested and he  was seen later in the day by Dr. Purcell Nails.  After examination of the  patient and review of available records including the catheterization films,  Dr. Cornelius Moras agreed that proceeding with coronary artery bypass grafting was the  preferred treatment choice for this gentleman.  The procedure, risks and  benefits were all discussed with [name] and his wife.  They agreed to  proceed with surgery.  He was scheduled as elective admission on Tuesday,  October 26, 2003.  On October 25, 2003, [name] experienced chest pain not  relieved by nitroglycerin at home.  He was admitted to the hospital under  the care of the [facility].  He was seen the  evening of October 25, 2003 by Dr. Purcell Nails.  His initial set of  cardiac enzymes were negative and his  chest pain was relieved in the  emergency room.  He has remained on the schedule for the OR on October 26, 2003.   HOSPITAL COURSE:  October 26, 2003, [name] underwent the following  surgical procedure with Dr. Tressie Stalker:  Coronary artery bypass grafting  x4.  Grafts placed at the time of procedure:  Left internal mammary artery  graft to the left anterior descending artery (with coronary endarterectomy  of LAD), saphenous vein was grafted to the intermediate artery, saphenous  vein was grafted to the posterior descending artery, left radial artery was  grafted to the second obtuse marginal artery.  Vein was harvested from the  right thigh via the endo-vein harvesting technique to the right calf with an  open surgical technique.  Significant findings at surgery included the  distal LAD with diffuse disease, and very poor targets for grafting,  requiring endarterectomy of the LAD.  [name] tolerated this procedure  reasonably well, transferring in stable condition to the SICU.  He remained   hemodynamically stable in the immediate postoperative period and was  extubated several hours after arrival in the intensive care unit.  He was  transfused with a unit of FFP as well as platelets for an INR of 2.1 and a  platelet count of 121,000.  [name] did experience some hypotension  related to vasodilation which required some Neo-Synephrine IV support.  He  also had some mild volume excess in the immediate postoperative period.  His  overall condition improved such that he was transferred out of the intensive  care unit on postoperative day 2.  Imdur was started for his right radial  artery conduit and Plavix was also started for his coronary endarterectomy.  [name] postoperative course was marked by several episodes of syncope  versus vasovagal episodes.  This was initially thought to be due to  medication effects and so his Imdur, Lopressor and Lasix were held.  These  events were not associated with any heart rhythm changes.  [name] related  that he had similar problems with orthostatic hypotension after his leg  surgeries.  He was closely monitored over the next several days and low-dose  beta blocker was resumed on postoperative day 6.  He remained stable and by  the morning of November 02, 2003, postoperative day 7, [name] reported  feeling very well.  His vital signs were stable with blood pressure 107/68,  he was afebrile, his room air saturation 92%.  His heart is maintaining  normal sinus rhythm at 92 beats per minute and his lungs are clear to  auscultation.  He was tolerating his diet without nausea.  His bowel and  bladder function were all within normal limits for him.  His incisions were  all healing well.  He had no lower extremity edema.  He was ambulating  without difficulty.  There were no further symptoms of syncope.  His pain  was well-controlled.  [name] was ready for discharge home on the morning  of November 02, 2003.  Note, [name] did have  elevated blood glucose levels on admission and in the early part during his hospitalization.  Hemoglobin  A1c was obtained on postoperative day 5 and the result was hemoglobin A1c of  5.7.  He was not treated with any hypoglycemic medications.   LABORATORY STUDIES:  November 01, 2003 chemistries include sodium of 136,  potassium 4.0, BUN 16, creatinine 0.9, glucose 101.  October 30, 2003 CBC:  White blood cells 9.1, hemoglobin 10.2, hematocrit 29.5, platelets 182,000.   CONDITION ON DISCHARGE:  Improved.   INSTRUCTIONS ON DISCHARGE:   MEDICATIONS:  1. Lipitor 40 mg daily.  2. Plavix 75 mg daily.  3. Tylox 1 to 2 q.4 h. p.r.n. for moderate-to-severe pain or Tylenol 325 mg     1 to 2 p.o. q.4 h. for mild pain.   ACTIVITY:  He is also instructed to refrain from any driving or any heavy  lifting, pushing or pulling.  He is also instructed to continue his  breathing exercises and daily walking.   DIET:  Diet should continue to be a heart-healthy diet.   WOUND CARE:  He is to shower daily with mild soap and water.  If his  incisions show any sign of infection or if he has a fever of greater than  101 degrees Fahrenheit, he is to call Dr. Orvan July office.   FOLLOWUP:  1. Dr. Aleen Campi should see him in his office in approximately 2 weeks and he     has been asked to call to arrange that appointment.  2. Dr. Cornelius Moras would like to see him at the [facility] office on Monday, November 29, 2003, at 11:45.  He will have a chest x-ray at [facility] at 10:45 in the     morning.      Toribio Harbour, N.P.                  Salvatore Decent. Cornelius Moras, M.D.    CTK/MEDQ  D:  11/23/2003  T:  11/24/2003  Job:  [id number]   cc:   Salvatore Decent. Cornelius Moras, M.D.  [address]  [place]  [place] [address]   Aram Candela. Aleen Campi, M.D.  [address]  [place]  [place] [address]  Fax: [phone]   Areatha Keas, M.D.  [address]  [address]  [place]  [place] [address]  Fax: [phone]

## 2011-02-16 NOTE — Discharge Summary (Signed)
[name], [name]                ACCOUNT NO.:  [id number]   MEDICAL RECORD NO.:  [id number]          PATIENT TYPE:  INP   LOCATION:  1518                         FACILITY:  [facility]   PHYSICIAN:  Mobolaji B. Bakare, M.D.DATE OF BIRTH:  08-19-1946   DATE OF ADMISSION:  04/09/2005  DATE OF DISCHARGE:  04/13/2005                                 DISCHARGE SUMMARY   PHYSICIANS:  1.  Dr. Phylliss Bob of [facility].  2.  Dr. Luther Parody, gastroenterologist.  3.  Dr. Aleen Campi.   FINAL DIAGNOSES:  1.  Epistaxis left nostril.  2.  Cholestatic jaundice.  3.  Hyperalbuminemia.  4.  Chronic liver disease.  5.  Renal insufficiency.  6.  Blood loss anemia.  7.  Hyponatremia.  8.  Hypotension.   PROCEDURE:  1.  Liver biopsy done by Dr. Sherrine Maples T. Fredia Sorrow on April 12, 2005.  2.  Left nasal packing done in the emergency department by emergency room      physician.   CONSULTATIONS:  Althea Grimmer. Luther Parody, M.D.   BRIEF HISTORY:  [name] is a pleasant 65 year old Caucasian male who was  recently hospitalized in [facility] for streptococcus  pyogenes bacteremia probably secondary to bacterial peritonitis. He  presented to the emergency room with a five-day history of left nostril  epistaxis which did not respond to nasal pressure on the day of  presentation. He was symptomatic with dizziness and probably lost about  eight pints of blood. He was hypotensive on arrival in the emergency  department with a blood pressure of 80/56. He had left nasal packing done  and obviously blood transfusion was initiated in the emergency department. I  did speak to Dr. Jeannett Senior. Pollyann Kennedy, on call for ENT, and he advised to keep the  nasal packing in for five days and he would be seeing the patient in the  office on Friday, April 13, 2005 for review and possible catheterization.   His INR on arrival was 1.5 with a slightly elevated pro time of 17.9 and a  PTT of 35. Of note is that he has an  underlying chronic liver disease.   Please refer to admission H&P for further details.   LABORATORY DATA:  Pertinent laboratory findings show at the time of  discharge white cell of 6.9, hemoglobin 8.7, hematocrit 24.3, MCV 19.6,  platelets 224,000. Liver function tests show total bilirubin 10.7, alkaline  phosphate 456, AST 258, ALT 82, total protein 6.3, albumin less than 1.7,  calcium 7.4. PTT 39, PT 18.3, INR 1.5. Blood culture no growth. Ammonia 41.   HOSPITAL COURSE:  Problem 1:  EPISTAXIS:  [name] received two units of  packed red blood cells. His hemoglobin of 11.1 and hematocrit 31.4 on  admission was not a true reflection of his hemoglobin status. Despite these  two units of packed red blood cells, hemoglobin at discharge was 8.7 and  hematocrit of 24.3. He did not have any further bleeding since his left  nostril was packed. Blood pressure improved with IV fluid and blood  transfusion.   Problem 2:  CHRONIC LIVER DISEASE:  The patient was seen in consultation by  Dr. Althea Grimmer. Santogade and decision was made to proceed with liver biopsy.  Given the fact that his repeat hepatitis C viral load PCR quantitive was  undetectable less than 50, he had this procedure done on April 12, 2005 by  interventional radiologist, Dr. Sherrine Maples T. Fredia Sorrow. Prior to the procedure,  he had two units of fresh frozen plasma. The patient tolerated the procedure  well without any complications.   Of note is that his admission bilirubin dropped from 17 to 10.7 at the time  of discharge and his liver enzymes were somewhat trending downward. Renal  insufficiency improved with IV fluid and blood transfusion; at the time of  discharge, his BUN and creatinine were 28 and 1.6 respectively.   Problem 3:  MALNUTRITION WITH SEVERE HYPOALBUMINEMIA:  This is thought to be  secondary to the chronic liver disease. [name] was started on Ensure  supplement which he was advised to continue using at home.    DISCHARGE MEDICATIONS:  1.  Augmentin 875 mg p.o. b.i.d.  2.  Nu-Iron 150 mg 1 daily.  3.  Lopressor 25 mg p.o. b.i.d.  4.  Ensure supplement 1-2 daily.   DISCHARGE INSTRUCTIONS:  He was informed to stop aspirin until he sees Dr.  Phylliss Bob in one to two weeks. Follow up in one week with Dr. Jeannett Senior. Rosen/Dr.  Zola Button T. Wolicki in the office on April 13, 2005 at 1 p.m. Follow  up with Dr. Althea Grimmer. Santogade as scheduled. Follow up with Dr. Areatha Keas  in one to two weeks. MRCP at [facility] on Saturday, April 14, 2005  at 7 a.m. and follow up with Dr. Althea Grimmer. Santogade.   CONDITION ON DISCHARGE:  The patient was hemodynamically stable and  ambulating without any difficulty.       MBB/MEDQ  D:  04/14/2005  T:  04/14/2005  Job:  [id number]   cc:   Areatha Keas, M.D.  [address]  [address]  [place]  [place] [address]  Fax: [phone]   Althea Grimmer. Luther Parody, M.D.  [address]  [place]  [place] [address]  Fax: [phone]   Gloris Manchester. Lazarus Salines, M.D.  [address] [place]  [place] [address]  Fax: [phone]

## 2011-02-16 NOTE — Consult Note (Signed)
NAMEMAXSON, ODDO                ACCOUNT NO.:  [id number]   MEDICAL RECORD NO.:  [id number]          PATIENT TYPE:  INP   LOCATION:  0155                         FACILITY:  [facility]   PHYSICIAN:  Charlaine Dalton. Sherene Sires, M.D. [facility] OF BIRTH:  04-07-1946   DATE OF CONSULTATION:  DATE OF DISCHARGE:                                   CONSULTATION   A critical care medicine consultation requested by Dr. Gust Rung.   REASON FOR CONSULTATION:  Shock.   HISTORY:  This is an exceptionally complicated 65 year old with known  hepatitis C cirrhosis with cholestasis, being evaluated as an outpatient  with the plan to do an ERCP this week.  He comes in having had a history  presenting with septic shock in June associated with positive cardiac  enzymes and also positive blood cultures for strep pyogenes.  He had 750 WBC  on ascitic fluid obtained several days after initiation of antibiotics, and  this fluid was sterile.  However, the presumptive diagnosis was spontaneous  bacterial peritonitis.  His course was complicated by respiratory failure  and refractory shock requiring mechanical ventilator and Xigris.  However,  he apparently recovered completely back to baseline after this admission,  and his cardiac work-up included an echocardiogram that did not show any  definite wall motion abnormalities (see study dated March 05, 2005).  He did  have mild increased wall thickness but an ejection fraction of 65% with only  mild left atrial dilatation and normal right-sided pressures with no obvious  vegetations.   The patient comes in now through the ICU with a history of shaking chills  last night associated with profound weakness but no cough, abdominal, chest  pain, sore throat, headache, nausea or vomiting.  He had a temperature of  104 in the emergency room and a normal blood pressure in the emergency room  but on arrival to the ICU, he had a blood pressure of only 60, and I was  called to evaluate him.   Dopamine in the meantime has been started.  The  patient was given a fluid bolus, and his blood pressure is back up into the  low 100s, albeit on 15 mcg of dopamine.   PAST MEDICAL HISTORY:  1.  Significant for the problems as outlined above.  2.  He also has history of psoriatic arthritis requiring methotrexate in the      past.  3.  Hyperlipidemia.  4.  Obesity.  5.  GERD.  6.  Cholecystectomy.  7.  Left femur fracture.  8.  Type 2 diabetes.   MEDICATIONS:  Nu-Iron and Lopressor.   ALLERGIES:  None known.   SOCIAL HISTORY:  She is not an active smoker or drinker and has no history  of alcohol use.   FAMILY HISTORY:  Negative for respiratory diseases.   REVIEW OF SYSTEMS:  Taken in detail as much as possible from the patient and  negative except as outlined above.   PHYSICAL EXAMINATION:  GENERAL:  This is a jaundiced-appearing, acutely ill  white male, who is nevertheless alert.  VITAL SIGNS:  Blood pressure  now is 120/80 with a pulse rate of 115 on 15  mcg of dopamine.  Temperature 104.  HEENT:  Oropharynx is clear.  Dentition is intact.  Mucosa is dry.  NECK:  Supple without cervical adenopathy or tenderness.  CHEST:  Completely clear bilaterally to auscultation and percussion.  HEART:  Regular rate and rhythm with a 1/6 __________ murmur.  No  displacement of PMI.  ABDOMEN:  Soft, minimal distension with no obvious ascites.  EXTREMITIES:  Warm without calf tenderness, cyanosis, clubbing, or edema.  NEUROLOGIC:  No focal deficits __________.  SKIN EXAM:  Warm and dry.   LABORATORY DATA:  White count is 8800 with a hematocrit of 31%.  Sodium is  125 with a creatinine of 1.3, BUN of 19.  Alkaline phosphatase is 687 with a  total bilirubin of 10.3, amylase of 102, lipase of 89.   Chest x-ray shows adequate aeration bilaterally with no definite infiltrates  or effusions.  Urinalysis shows trace leukocyte esterase, minimal WBC, serum  albumin is 1.4.   IMPRESSION:   Apparent recurrent septic shock in a patient who previously had  a strep pyogenes of unclear etiology.  The most likely cause of the sepsis  was felt to be spontaneous bacterial peritonitis.  Further discharge summary  I reviewed today but do not have the actual chart which is at [facility] (the  patient was seen by Dr. Ninetta Lights).  The differential here includes recurrent  spontaneous bacterial peritonitis versus ascending cholangitis with no other  obvious leads in terms of a cause for recurrent sepsis.  He has already been  treated with vancomycin, Rocephin, clindamycin which would cover all the  usual suspects given either of these possibilities, and I simply recommended  placing a central line to restore his central venous pressure to 14-16 and  then to check a CvO2 saturation using the early goal-directed therapy  sepsis protocol.  The issue is whether or not he would benefit from being  treated with Xigris a second time.  If he remains with pressor-dependent  shock after restoration of volume, we might need to consider this, but for  now we will focus on early goal-directed therapy and also check a serum  cortisol which is pending.       MBW/MEDQ  D:  04/21/2005  T:  04/21/2005  Job:  [id number]

## 2011-02-16 NOTE — Discharge Summary (Signed)
NAMEZAYVEN, POWE                ACCOUNT NO.:  [id number]   MEDICAL RECORD NO.:  [id number]          PATIENT TYPE:  INP   LOCATION:  2016                         FACILITY:  [facility]   PHYSICIAN:  Hettie Holstein, D.O.    DATE OF BIRTH:  03/07/1946   DATE OF ADMISSION:  03/03/2005  DATE OF DISCHARGE:                                 DISCHARGE SUMMARY   DATE OF INTERIM SUMMARY:  03/15/2005.   PRIMARY CARE PHYSICIAN:  Dr. Phylliss Bob of [facility]   CARDIOLOGIST:  Dr. Aleen Campi.   GASTROENTEROLOGIST:  Dr. Luther Parody.   NEUROLOGIC:  Dr. Arlean Hopping.   INFECTIOUS DISEASE:  Dr. Ninetta Lights.   ADMISSION DIAGNOSIS:  Chest pain and septic shock.   DIAGNOSES AT TIME OF THIS DICTATION:  1.  Chest pain and non-ST segment elevation MI in a setting of septic shock,      status post cardiology evaluation this admission and resolution of      symptoms following stabilization of his presentation in extremis.  2.  Septic shock requiring Intensive Care Unit management including      respiratory failure and placement on a ventilator, in addition to acute      renal failure, multi-system failure with course in the unit requiring      Xigris as well as CVDHD and successful wean out of the Intensive Care      Unit setting.  3.  Group A strep pyogenes bacteremias discovered on blood cultures drawn      March 03, 2005, initiated on appropriate anti-microbial therapy to      complete a 14 day course with Rocephin.  The source of cultures not      firmly determined.  Dr. Ninetta Lights of infectious diseases has been      following and current recommendation is to complete course of Rocephin      for 14 days.  4.  Clinically suspected spontaneous bacterial peritonitis based on data      results from paracentesis performed on March 12, 2005.  The sample      revealed 750 WBCs, however the patient had been on several days' course      of antibiotics therapy and the clinical suspicion was that of partially  treated SBP, cultures currently pending at this time and recommendations      were again that of 14 days of Rocephin.  5.  Left lower extremity cellulitis status post venipuncture for      hemodialysis catheter with resolution of surrounding erythema and      cellulitis, with 5 days of total completion of Vancomycin with endpoint      to be further determined by infectious disease.  6.  Cirrhosis of the liver.  7.  Acute hepatitis this admission thought to be related to recent episode      of sepsis with subsequent cholestasis and elevated bilirubin in the      setting of underlying cirrhosis, secondary to chronic hepatitis C.      However the hepatitis C PTR was negative.  His hepatitis C antibody was  positive for previous exposure to hepatitis C.  In review of the imaging      studies there is no mention of cirrhosis or cirrhotic appearance,      however it had clinically been suspected that this patient had cirrhosis      due to chronic hepatitis C.  8.  Hyperbilirubinemia thought to be secondary to cholestasis.  His current      bilirubin is over 21 and he has undergone ultrasound for evaluation      without evidence of obstructive findings.  He is status post      cholecystectomy.  Again, gastroenterologist Dr. Luther Parody as well as Dr.      Randa Evens has been following in this regard.  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  We will need to keep a close watch on      his renal function until some resolution of his mixed diagnosis of non-      ST segment elevation MI in a setting of extremis on presentation with      underlying coronary artery disease, status post CABG in January of 2005      by Dr. Cornelius Moras.  He has been followed by cardiology services here,      [facility] and Dr. Aleen Campi, with recommendations      for follow up outpatient in 3 or 4 weeks  following discharge in      preparation for cardiac catheterization at that time and medical      management of his coronary disease at that time, mild coagulopathy      correcting with vitamin K, emphasizing preserving the synthetic function      of his liver to a degree.   CURRENT MEDICATIONS:  1.  Aspirin 81 mg daily.  2.  Low-molecular weight heparin, prophylactic dose.  3.  Ensure t.i.d. with ProMod insulin sliding scale.  4.  Lopressor 25 mg p.o. q.12h.  5.  Protonix 40 mg daily.  6.  Vancomycin IV q.12h.  7.  Rocephin 1 gram IV q.24h.   DISPOSITION:  At this time we are awaiting further resolution of his acute  symptomatology prior to transition to rehab setting.  He has been evaluated  by physical therapy and occupational therapy and this is the current  recommendation.  His functional status is that of slow improvement.  His  nutritional status is quite poor with albumin of 1.4 presently..   TESTS PERFORMED:  ICU course with short course on vent in addition to CVDHD  and the Xigris.  He underwent ultrasound-guided paracentesis with findings  as described above.  He underwent transthoracic echocardiogram revealing  preserved cardiac function.   HOSPITAL COURSE:  Mr. Hoeffner' course has been quite eventful though he has  progressed quite well following extubation from the Intensive Care Unit and  continuation of antibiotic therapy.  He has remained hemodynamic stable and  his neurologic status has slowed improved and he is alert and oriented.  He  continues to remain jaundiced.  He has undergone diagnostic paracentesis  with cultures pending at this time.  He may need to undergo repeat abdominal  paracentesis if he does develop reaccumulation of ascites.  There are few  issues and questions with regards to Mr. Jayne, that is of the infections  leading to his presentation as well as the  status of his hepatitis C and negative PCR, in addition to the question of cirrhosis.  He  does appear to  be improving markedly and we will continue to follow his course, follow him  supportively and shoot for transition to a rehabilitation unit course.       ESS/MEDQ  D:  03/15/2005  T:  03/15/2005  Job:  [id number]

## 2011-02-16 NOTE — Consult Note (Signed)
[name], [name]                ACCOUNT NO.:  [id number]   MEDICAL RECORD NO.:  [id number]          PATIENT TYPE:  INP   LOCATION:  0155                         FACILITY:  [facility]   PHYSICIAN:  Bernette Redbird, M.D.   DATE OF BIRTH:  20-Jan-1946   DATE OF CONSULTATION:  04/21/2005  DATE OF DISCHARGE:                                   CONSULTATION   REFERRING PHYSICIAN:  Mallory Shirk, M.D.   REASON FOR CONSULTATION:  Dr. Gust Rung of the [facility] hospitalists asked me  see this 65 year old gentleman because of elevated liver chemistries with a  recurrent septic picture.   [name] was admitted to the hospital about a month ago in septic shock,  ended up on the ventilator and grew out strep pyogenes from two out of two  blood cultures.  The source of that sepsis was never clearly identified.   Part of the patient's presentation is acute on chronic liver disease.  The  patient has been treated with methotrexate for psoriasis through the years  and has had serial biopsies in the recent years showing minimal psoriasis  changes.  There is also a history of positive hepatitis C serology, although  viral quantitation at the time his last hospital was negative, suggesting a  resolved infection or exposure.   The patient was seen by and has been followed by Dr. Roosvelt Harps most  recently.   The patient is remotely status post cholecystectomy, and a recent MRCP  showed no evidence of a common duct stone, but did show some degree of  progressive biliary ductal dilatation with a roughly 13 mm common duct  diameter.   The patient also had ascites with marked hypoalbuminemia at the time of this  last admission, specifically, his serum albumin was less than 1.0.  The  ascites had an elevated cell count suggestive of bacterial peritonitis,  although an organism was not recovered from the fluid, which had been  sampled after antibiotics were started.   The patient runs a normal platelet  count and does not have a history of  cirrhosis as such.  A recent ultrasound-guided biopsy obtained about a week  ago showed grade 3, stage III hepatitis of unclear cause, with some evidence  of cholestatic changes.   The patient was admitted to the hospital most recently, and discharged about  5 days ago, because of severe epistaxis with an estimated blood loss of  about 8 units of blood.  This was treated by ENT.  It was during that  admission that the liver biopsy was electively performed.   Note, that the patient has a history of severe elevation of liver  chemistries with a bilirubin around 20 at the time of his last admission; it  had fallen to eight recently as an outpatient when checked a few days ago in  the office.   With all that background, the patient has been off Augmentin for the past  several days and was on diuretic therapy for his ascites.  He felt fine when  he went to bed last night, but shortly thereafter  awakened with shaking  chills and fever.  He did not have frank abdominal pain, no nausea, vomiting  or diarrhea.  He presented to the emergency room this morning where he was  hypotensive (pressure got down to the 60s, requiring use of dopamine to  maintain adequate pressure).  He had a documented fever of 104 degrees  rectally, yet interestingly, his white count was unimpressive the 8800 (84%  polys, 15 lymphs) and his liver chemistries were not dramatically different  than previously (AST less than five which is substantially lower the most  recently, SGPT 102 which is slightly higher than the 82 that was measured in  the hospital about 9 days ago).  Alk phos had crept up from about 450 to  roughly 700, where as bilirubin have fallen slightly from 10.7 to 10.3  during that same interval.  Lipase was slightly elevated at 89, with a  normal amylase. The clinical significance of this is unclear.   ALLERGIES:  No known allergies.   OUTPATIENT MEDICATIONS:   Lopressor, iron supplement, Augmentin (several days  ago), furosemide and spironolactone.  He had previously been on aspirin but  this has been on hold most recently.   PAST SURGICAL HISTORY:  1.  Appendectomy.  2.  Open cholecystectomy about 15 years ago which time the distal bile duct      stricture was apparently noted.  3.  CABG about 1-1/2 years ago.   PAST MEDICAL HISTORY:  1.  Coronary disease.  2.  Hyperlipidemia with esophagitis.  3.  Type 2 diabetes.  4.  History of chronic liver disease as noted above with possible element of      hepatitis C exposure.  5.  Methotrexate for psoriasis treatment.   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   REVIEW OF SYSTEMS:  See HPI physical exam.   PHYSICAL EXAMINATION:  GENERAL:  An acutely ill gentleman in no acute  distress, but appearing weak and washed out.  His skin is bronze.  HEENT:  The oropharynx shows dry mucous membranes.  CHEST:  Clear to auscultation, perhaps a couple scattered rales.  HEART:  Normal except for slightly rapid rate.  ABDOMEN:  Slightly protuberant with flank dullness suggesting possible  ascites.  No organomegaly, guarding, mass or tenderness are appreciated with  specific reference to the right upper quadrant.  EXTREMITIES:  Have perhaps trace edema, nothing impressive.   LABORATORY DATA AND X-RAY FINDINGS:  White count 8800 with 84 polys, 15  lymphs, hemoglobin 10.8, MCV 92, RDW 19.3 which is elevated, platelets  240,000.  INR 1.4. Sodium 125, creatinine 1.3, BUN 19, glucose 92, bicarb  20.  Total bilirubin 10.3, Alk phos 687, AST less than 5, ALT 102, albumin  1.4.  Amylase normal at 102.  Lipase slightly elevated 89.  Urinalysis  specific gravity 1.012, large amount of bilirubin, trace leukocytes with 0-2  white cells per high-powered field.  Chest x-ray clear.   IMPRESSION:   1.  Acute septic presentation, recurrent and reminiscent of what problem      into the hospital about 6 weeks ago.  2.  Chronic liver disease with biopsy-proven grade 3, stage III changes,      with possible element of cholestasis.  Liver issues include a positive      hepatitis C serology, but with negative viral quantitation when recently  check.  3.  History of methotrexate exposure.  4.  History of a remote cholecystectomy with possible distal biliary      stricturing, although nothing impressive on recent magnetic resonance      cholangiopancreatography.  5.  Dilated biliary system, chronic, but possibly progressive, questionable      etiology (? ampullary stenosis), no obvious stricture on recent magnetic      resonance cholangiopancreatography, no evident stone on that study.  6.  Ascites on recent magnetic resonance cholangiopancreatography with      recent probable peritonitis characterized by high cell count albeit      culture was negative.  7.  History of recent strep pyogenes sepsis of unclear source.  8.  Severe malnutrition with depletion of visceral protein stores.   DISCUSSION AND RECOMMENDATIONS:  For now, I think the most prudent course of  action is observation on antibiotic therapy.  The main question in my mind  is whether this could be cholangitis, for which the picture of a dilated  biliary system, elevated liver chemistries and the abrupt onset of fever and  chills would all be compatible.  On the other hand, he has no right upper  quadrant pain or tenderness, and his liver chemistries are not substantially  changed from what they have recently as might be expected with septic  cholangitis.  Moreover, given the patient's current hemodynamic instability,  I think that ERCP would be quite risky, and in the absence of a clear cut  biliary source the patient's current sepsis and/or of the absence of a  demonstrated lack of response to antibiotics, I think the  risk benefit ratio  for doing an ERCP would be unfavorable.   I do think that monitoring liver chemistries will be helpful, even though  progressive elevation of those could be reflective of reactive hepatopathy  rather than cholangitis.   I will plan to follow this patient with you and appreciate the opportunity  to have seen him.       RB/MEDQ  D:  04/21/2005  T:  04/21/2005  Job:  [id number]   cc:   Areatha Keas, M.D.  [address]  [address]  [place]  [place] [address]  Fax: [phone]   Althea Grimmer. Luther Parody, M.D.  [address]  [place]  [place] [address]  Fax: [phone]   Antionette Char, MD  [address]  [place]  [place] [address]  Fax: [phone]

## 2011-02-16 NOTE — H&P (Signed)
[name], [name]                ACCOUNT NO.:  [id number]   MEDICAL RECORD NO.:  [id number]          [name] TYPE:  INP   LOCATION:  0101                         FACILITY:  [facility]   PHYSICIAN:  Mallory Shirk, MD     DATE OF BIRTH:  07/11/1946   DATE OF ADMISSION:  04/21/2005  DATE OF DISCHARGE:                                HISTORY & PHYSICAL   CHIEF COMPLAINT:  Fever.   HISTORY OF PRESENT ILLNESS:  [name] is a very pleasant, 65 year old  Caucasian gentleman with a history of hepatic disease and cholestatic  jaundice who presented to Alan emergency department of [facility] on  04/21/2005 with complaints of fever.  [name] was discharged from [facility] on April 14, 2005 after being admitted for epistaxis.  This was  addressed by Dr. Lazarus Salines after [name] was discharged from Alan hospital.  He underwent cauterization.  [name]'s wife states that since discharge  from Alan hospital, [name] had been a little weak.  His jaundice had not  resolved.  Starting yesterday, [name] had some abdominal pain and was  complaining of some nausea without vomiting.  [name] also had a high  temperature and was brought into Alan ED.  On arrival in Alan ED, his  temperature was 104.4.  He was given ibuprofen 800 mg p.o., upon which Alan  temperature was down to 104.2.  No other complaints at this time.   [name]'s blood pressure in Alan ER steadily dropped and at one point was  72 systolic.  IV fluids were started with normal saline wide open.  Alan  [name] responded some, with pressures coming up systolic of 78.  Alan  [name] will be admitted to Alan ICU.   PAST MEDICAL HISTORY:  Significant for:  1.  Hospitalization in June for Strep pyogenes bacteremia, presumed to be      secondary to bacterial peritonitis.  2.  Non-ST-elevation MI.  3.  Septic shock.  4.  A history of multiorgan failure during hospitalization in June,      including vent-dependent respiratory  failure, acute renal failure,      needing CVD/HD.  5.  Jaundice with elevated transaminases.  6.  A history of hepatitis C infection.  7.  Left lower extremity cellulitis.  8.  Coronary artery disease, status post CABG in 2005.  9.  GERD.  10. Hyperlipidemia.  11. Psoriatic arthritis.  12. Diet-controlled diabetes mellitus.   PAST SURGICAL HISTORY:  1.  Cholecystectomy.  2.  Appendectomy.  3.  Repair of left femoral fracture.  4.  CABG, three vessels in January of 2005.   MEDICATIONS ON ADMISSION:  1.  Lopressor 25 mg p.o. b.i.d.  2.  Aldactone 25 mg p.o. daily.  3.  Furosemide 20 mg p.o. daily.  4.  Iron complex, Nu-Iron, 150 mg p.o. daily.   ALLERGIES:  [name] has no known drug allergies.   FAMILY HISTORY:  Noncontributory.   SOCIAL HISTORY:  [name] lives with his wife.  No alcohol, tobacco or  drug use.  Independent  activities of daily living.   REVIEW OF SYSTEMS:  More than 10 systems were reviewed.  Other than HPI, Alan  [name] has no other pertinent positives in Alan review of systems.   PHYSICAL EXAMINATION ON ADMISSION:  VITAL SIGNS:  Blood pressure 119/72,  pulse 106, respirations 24, temperature 104.2.  GENERAL:  Very pleasant, elderly Caucasian gentleman lying in bed in no  acute distress, alert and oriented times three.  Wife and son are at  bedside.  HEENT:  Normocephalic, atraumatic.  PERRL.  Sclerae are icteric.  Mucous  membranes are moist.  NECK:  Supple.  No LAD, no JVD.  No carotid bruits.  Oropharynx is  nonerythematous.  LUNGS:  Clear to auscultation bilaterally.  No wheezes, no rales.  CARDIOVASCULAR SYSTEM:  S1 plus S2, tachycardic.  No murmurs, rubs or  gallops.  ABDOMEN:  Soft.  Positive bowel sounds.  No tenderness, no masses, no fluid  thrill elicited.  GU:  Tinea cruris noted in Alan inguinal areas.  EXTREMITIES:  No cyanosis, clubbing or edema.  SKIN:  Jaundiced all over, stage I ulcer seen on sacrum secondary to  psoriasis.   NEUROLOGIC:  Cranial nerves II-XII grossly intact.  Sensory and motor within  normal limits.  No focal deficits seen.   LABORATORY:  Chest x-ray shows bibasilar atelectasis.  No acute disease.   WBCs 8.8, hemoglobin 10.8, hematocrit 30.6, platelets 240.  Amylase 102,  lipase 89, sodium 125, potassium 4.2, chloride 97, carbon dioxide 20,  glucose 92, BUN 19, creatinine 1.3, calcium 7.8, albumin 1.4, AST less than  5, ALT 102, alkaline phosphatase 687, total bilirubin 10.3.   ASSESSMENT/PLAN:  A 65 year old Caucasian gentleman admitted with high  fevers.  1.  Septic shock.  [name]'s blood pressure has dropped from 119      systolic to 72 systolic.  IV fluids are being administered wide open.      He will be started on a dopamine drip if he does not respond to IV      fluids.  [name] was given Rocephin and clindamycin in Alan ED.  We      will continue this.  We have also added vancomycin 1 gm IV now and      subsequently to be dosed by pharmacy.  Blood cultures times two have      been drawn.  Dr. Francella Solian, critical care medicine, will be seeing Alan      [name] in Alan ICU, where [name] is being transferred.  2.  Cholestatic jaundice.  [name] was seen by Dr. Bernette Redbird from      [facility].  An ERCP is planned at some point.  However, [name] needs      to be hemodynamically stable before this procedure is obtained.  Dr.      Matthias Hughs will be following [name].  At Alan present time [name]      is n.p.o.  3.  Pancreatitis.  [name]'s lipase is 89.  [name] will be given IV      fluids and is n.p.o. except for medications.  At Alan present time Alan      [name] is not complaining of any abdominal pain.  4.  Diabetes mellitus.  This is diet controlled.  We will place [name]      on a sliding scale.  We will check a hemoglobin A1c.  5.  Coronary artery disease.  [name] is not complaining  of any chest     pain.  However, we will check CK-MB  and troponin I times three, Alan      first one to be done now.  EKG showed sinus tachycardia.  An old EKG is      not available at Alan present time.  6.  Prophylaxis.  This [name] is on SCDs for DVT prophylaxis and Protonix      for GI prophylaxis.   DISPOSITION:  After resolution of acute issues and hemodynamically stable,  [name] will be transferred to Alan floor, at which time an ERCP will be  contemplated and further management of his cholestatic be addressed.  After  workup is complete, [name] will be discharged home.       GDK/MEDQ  D:  04/21/2005  T:  04/21/2005  Job:  [id number]   cc:   Bernette Redbird, M.D.  [address], [address]  [place], [place] [address]  Fax: [phone]   Areatha Keas, M.D.  [address]  [address]  [place]  [place] [address]  Fax: [phone]   Gloris Manchester. Lazarus Salines, M.D.  [address] [place]  [place] [address]  Fax: [phone]   Althea Grimmer. Luther Parody, M.D.  [address]  [place]  [place] [address]  Fax: [phone]

## 2011-02-16 NOTE — Consult Note (Signed)
NAME:  [name], [name]                          ACCOUNT NO.:  [id number]   MEDICAL RECORD NO.:  [id number]                   PATIENT TYPE:  INP   LOCATION:  7001                                 FACILITY:  [facility]   PHYSICIAN:  Salvatore Decent. Cornelius Moras, M.D.              DATE OF BIRTH:  1946/09/11   DATE OF CONSULTATION:  10/21/2003  DATE OF DISCHARGE:                                   CONSULTATION   REQUESTING PHYSICIAN:  John R. Aleen Campi, M.D.   REASON FOR CONSULTATION:  Left main disease, three-vessel coronary artery  disease, Class II stable angina.   HISTORY OF PRESENT ILLNESS:  [name] is a 65 year old white male with no  previous cardiac history but risk factors including history of obesity,  hyperlipidemia, and a family history of coronary artery disease. The patient  is followed by Dr. Chase Picket for management of psoriatic arthritis. At the  time of his most recent routine annual physical examination, he reported  long standing history of stable symptoms of exertional shortness of breath  with mild exertional substernal chest tightness. These symptoms have been  mild and stable for many years. The patient was referred for elective  cardiac evaluation and stress Cardiolite exam. The patient was seen by Dr.  Aleen Campi and underwent stress Cardiolite study on January 18. This test was  notable for resting ejection fraction of 60% with evidence of ischemia  involving the apex, anterior and septal wall, and the posterior walls. The  patient was subsequently brought in for elective cardiac catheterization  today. This demonstrates 70% left main coronary stenosis and severe three-  vessel coronary artery disease with preserved left ventricular function.  Cardiac surgical consultation was requested.   REVIEW OF SYSTEMS:  GENERAL:  The patient reports feeling well. He has a  good appetite and has not been loosing weight or gaining weight recently.  CARDIAC:  The patient reports vague mild  symptoms of substernal chest  tightness with more pronounced shortness of breath that is brought on only  with relatively strenuous physical exertion and promptly relieved by rest.  The patient denies any episodes of severe substernal chest tightness or  shortness of breath, either at rest or with mild activity. He denies any  nocturnal chest pain, PND, orthopnea, palpitations, or syncope. He has  occasional mild bilateral lower extremity edema which he attributes to  arthritis. RESPIRATORY:  Negative. The patient has only mild exertional  shortness of breath, and he denies problems with severe shortness of breath,  productive cough, hemoptysis, wheezing. GASTROINTESTINAL:  Negative. The  patient has occasional mild symptoms of GE reflux or dyspepsia which has  been controlled with antiacid therapy. The patient denies any difficulty  swallowing or pain with swallowing. The patient denies any problems with  constipation, hematochezia, hematemesis, melena. NEUROLOGICAL:  Negative.  The patient denies episodes of transient monocular blindness or transient  numbness or weakness involving either  upper or lower extremities. He does  have some mild chronic numbness in the left hip and left upper leg related  to his multiple orthopedic surgical procedures. He denies any history of  seizures. MUSCULOSKELETAL:  Notable for long standing history of psoriatic  arthritis. The patient has migrating arthritis which comes and goes in  flares. Has been well controlled with oral Naprosyn therapy, and when the  patient has a particularly severe flare, he will use a short course of  methotrexate therapy which usually brings it right under control. Presently,  his left thumb and MCP joint are the most severe afflicted although his  arthritis has affected most of his joints in his body at one time or another  in the past. He has mild chronic pain and gait disturbance related to  multiple surgical procedures on  his left hip and left leg. He is still able  to walk reasonably well without significant limitation. INFECTIOUS:  Negative. The patient denies recent fevers or chills. GENITOURINARY:  Negative. The patient denies urinary urgency, frequency, or dysuria. HEENT:  Negative. The patient denies any loose teeth, although he does not have many  of his native teeth left. PSYCHIATRIC:  Negative. ENDOCRINE:  Negative. The  patient denies known history of diabetes. SKIN:  Notable for mild chronic  rashes related to psoriasis which primarily affects his lower back side.   PAST MEDICAL HISTORY:  1. Psoriasis with psoriatic arthritis.  2. GE reflux disease.  3. Recent elevated liver function tests obtained early following use of     methotrexate.  4. Hyperlipidemia.  5. Obesity.   PAST SURGICAL HISTORY:  1. Appendectomy.  2. Cholecystectomy.  3. Left femur fracture x2 with multiple surgical procedures and revisions.   FAMILY HISTORY:  Notable with a strong presence of coronary artery disease.  The patient's mother is still alive with heart problems for many years. The  patient's younger brother just had coronary bypass surgery.   SOCIAL HISTORY:  The patient is married and lives [place] his [place] He is currently employed as a Electrical engineer. He denies any  history of significant tobacco use although in the distant past he has  smoked an occasional cigar. He denies history of significant alcohol  consumption.   CURRENT MEDICATIONS:  1. Naprosyn 500 mg twice daily.  2. Nexium 40 mg once daily.  3. Folic acid one tablet daily.  4. The patient occasionally uses methotrexate as needed for flares of his     arthritis. Most recently, this occurred in December of 2004.   DRUG ALLERGIES:  None known.   PHYSICAL EXAMINATION:  GENERAL:  Notable for well appearing, moderately  obese white male who appears his stated age in no acute distress.  HEENT:  Essentially  unrevealing. NECK:  Supple. There is no cervical or supraclavicular lymphadenopathy.  There is no jugular venous distention. There are no carotid bruits.  Auscultation of the chest reveals clear and symmetrical breath sounds  bilaterally. No wheezes or rhonchi are noted.  CARDIOVASCULAR:  Regular rate and rhythm. No murmurs, rubs, or gallops are  noted.  ABDOMEN:  Moderately obese. Soft and nontender. The liver edge is not tender  or palpable. Bowel sounds are present.  EXTREMITIES:  Warm and well perfused. There is no lower extremity edema.  There is some deformities of MCP joints in both hands, more pronounced on  the left than right. There is some skin changes with chronic psoriatic  ulcerations on  the lower back just above the buttocks.  NEUROLOGICAL:  Grossly nonfocal and symmetric throughout.  PERIPHERAL VASCULAR EXAM:  Notable for palpable posterior tibial pulses in  both lower legs at the ankles. There is no sign of venous insufficiency. The  left radial and ulnar pulses are both palpable, and the capillary refill of  the left hand is brisk even during occlusion of the left radial artery.  RECTAL/GENITOURINARY:  Exams both deferred.  NEUROLOGICAL:  Grossly nonfocal.   DIAGNOSTIC TESTS:  Cardiac catheterization performed today by Dr. Aleen Campi  has been reviewed. This demonstrates heavily calcified coronary arteries  with severe three vessel coronary artery disease and left main coronary  stenosis. Specifically, there is 70% stenosis of the distal left main  coronary artery. There is 90% ostial stenosis of the left anterior  descending coronary artery followed by 100% occlusion of this vessel. There  is 90% proximal stenosis of a large ramus intermediate branch and 90 to 95%  proximal stenosis of the circumflex marginal branch. There is 70% proximal  stenosis of the right coronary artery with 60 and 70% stenosis of the mid  portion of this vessel as well as 95% proximal stenosis  of the posterior  descending coronary artery. There is left to right collateral filling of the  distal right coronary circulation and right to left collateral filling of  the distal left anterior descending coronary artery. Left ventricular  function is remarkably well preserved with ejection fraction estimated 55 to  60%.   IMPRESSION:  Left main disease with severe three-vessel coronary artery  disease and preserved left ventricular function with symptoms of chronic  stable exertional angina. I believe that [name] would best be treated by  elective coronary artery bypass grafting.   PLAN:  I have outlined the options at length with [name] and his wife.  The alternative treatment strategies have been discussed. They understand  and accept all associated risks of surgery including but not limited to  risks of death, stroke, myocardial infarction, congestive heart failure,  respiratory failure, pneumonia, bleeding requiring blood transfusion, arrhythmia, infection, and recurrent coronary artery disease. All of their  questions have been discussed. We tentatively plan to proceed with surgery  first case Tuesday, January 25. We will treat [name] during the interim  period of time with Bactroban nasal ointment due to his history of staph  aureus complicating infections from surgeries he has had in the past.                                               Salvatore Decent. Cornelius Moras, M.D.    CHO/MEDQ  D:  10/21/2003  T:  10/22/2003  Job:  [id number]   cc:   Areatha Keas, M.D.  [address]  [address]  [place]  [place] [address]  Fax: [phone]

## 2011-02-16 NOTE — H&P (Signed)
NAMEBENTLY, WYSS NO.:  [id number]   MEDICAL RECORD NO.:  [id number]          PATIENT TYPE:  EMS   LOCATION:  ED                           FACILITY:  [facility]   PHYSICIAN:  Renato Battles, M.D.     DATE OF BIRTH:  1946/09/14   DATE OF ADMISSION:  03/03/2005  DATE OF DISCHARGE:                                HISTORY & PHYSICAL   PRIMARY CARE PHYSICIAN:  Areatha Keas, M.D.   CARDIOLOGIST:  Antionette Char, MD   REASON FOR ADMISSION:  Chest pain and shortness of breath.   HISTORY OF PRESENT ILLNESS:  The patient is a 65 year old white male who has  experienced severe left anterior upper chest pain for the last 12 hours. It  started at rest while the patient was trying to go to sleep with radiation  to the left upper extremity associated with shortness of breath, nausea, and  vomiting, in relation to fever and sweating. The patient went to the sleep  nonetheless, vomited all over the floor and in the morning the pain  continued to worsen, so he was brought to the emergency room. Initial  evaluation in the emergency room shows that the patient was tachycardic,  tachypneic, and just looking uncomfortable and in moderate distress. The  patient tells me that he had similar pain on the same side about one month  ago. He went to his cardiologist, had a stress test, and was negative. At  this time the patient continued to have chest pain in that area, continued  to have shortness of breath. The nausea and vomiting have resolved. He  continued to have some chills.   REVIEW OF SYSTEMS:  CONSTITUTIONAL: Positive for subjective fever and  chills. Positive sweats, no weight changes. GI: Positive for nausea and  vomiting. No diarrhea or constipation. CARDIOPULMONARY: Positive for chest  pain, shortness of breath. No orthopnea or PND. No cough. GU: No dysuria,  hematuria, or retention. However, his wife reported that for the last few  days the patient has been having dark colored  urine.   PAST MEDICAL HISTORY:  1.  CAD.  2.  GERD and hiatal hernia.  3.  Hyperlipidemia.  4.  Psoriatic arthritis.  5.  Diet-controlled diabetes.  6.  History of Staph bacteremia.   PAST SURGICAL HISTORY:  1.  CABG in January 2005, three vessels.  2.  Cholecystectomy.  3.  Appendectomy.  4.  Left femur repair secondary to trauma fracture with revisions.   SOCIAL HISTORY:  The patient lives with his wife in [place], [place],  is retired with part-time job with his son in [employer]. No tobacco,  alcohol, or drugs.   FAMILY HISTORY:  Negative.   DRUG ALLERGIES:  No known drug allergies.   HOME MEDICATIONS:  1.  Naproxen 500 mg p.o. b.i.d.  2.  Lipitor 40 mg p.o. daily.  3.  Toprol XL 25 mg p.o. daily.  4.  Nitroglycerin.  5.  Plavix 75 mg p.o. daily.  6.  Prilosec OTC.   PHYSICAL EXAMINATION:  GENERAL: The patient is in moderate  distress, is  tachypneic, and just looking sick and pale.  VITAL SIGNS: Temperature 96.7, heart rate 114, respiratory rate 25, blood  pressure 95/59 on a dopamine drip. Oxygen saturation 93% on nasal cannula.  HEENT: The head is normocephalic and atraumatic. Pupils equal, round, and  reactive to light and accommodation. Extraocular movements are intact  bilaterally. Mucous membranes are very dry.  NECK: No lymphadenopathy, no thyromegaly, no JVD.  CHEST: Clear to auscultation bilaterally. The patient has a very significant  left upper anterior tender spot on his chest. There is no erythema or other  localizations.  HEART: Regular rhythm, tachycardia. No murmurs.  ABDOMEN: Soft, nontender, nondistended. Decreased bowel sounds.  EXTREMITIES: No clubbing, cyanosis, or edema.  SKIN: The patient has psoriatic involvement of his scalp. No other clear  significant patches of psoriasis.  NEUROLOGIC: The patient is alert and oriented times three, although sleepy,  easy to arouse. Neurologic exam is grossly negative.   STUDIES:  CBC shows  white count of 10.8 with more than 20% bandemia,  hemoglobin 14.5, platelet count 159,000. Electrolytes are all within normal  limits. Liver functions showed albumin of 2.9, elevated AST of 176, elevated  alkaline phosphatase at 369, elevated bilirubin 3.5. PT and INR were normal.  D-dimer was mildly elevated at 0.82.  Cardiac enzymes in the emergency room  were all negative.   EKG was negative. CT of the chest shows no PE, no infiltrates, and no other  abnormalities that I can tell of.   ASSESSMENT:  1.  Chest pain concerning for cardiac.  2.  Questionable septic shock.  3.  Exquisite tenderness in the left upper anterior chest.  4.  Gastroesophageal reflux disease.  5.  Hypercholesterolemia.   PLAN:  1.  Admit to ICU.  2.  Obtain blood, urine, and sputum cultures.  3.  Start wide-spectrum antibiotics including Zosyn and vancomycin.  4.  Check cardiac enzymes and CPK.  5.  Bolus with IV fluids and start Levophed.  6.  Check cortisol and lactic acid levels.  7.  Continuous monitoring of vital signs and EKG.  8.  Check right upper quadrant ultrasound.       SA/MEDQ  D:  03/03/2005  T:  03/04/2005  Job:  [id number]   cc:   Areatha Keas, M.D.  [address]  [address]  [place]  [place] [address]  Fax: [phone]   Antionette Char, MD  [address]  [place]  [place] [address]  Fax: [phone]

## 2011-02-16 NOTE — Op Note (Signed)
   NAME:  CHEY, CHO                          ACCOUNT NO.:  [id number]   MEDICAL RECORD NO.:  [id number]                   PATIENT TYPE:  AMB   LOCATION:  ENDO                                 FACILITY:  [facility]   PHYSICIAN:  Georgiana Spinner, M.D.                 DATE OF BIRTH:  1945-11-07   DATE OF PROCEDURE:  DATE OF DISCHARGE:                                 OPERATIVE REPORT   PROCEDURE:  Colonoscopy.   INDICATIONS FOR PROCEDURE:  Colon cancer screening.   ANESTHESIA:  Demerol 20 mg.   DESCRIPTION OF PROCEDURE:  With the patient mildly sedated in the left  lateral decubitus position, the Olympus video colonoscope was inserted in  the rectum and passed under direct vision to the cecum identified by the  ileocecal valve and appendiceal orifice both of which were photographed.  From this point, the colonoscope was slowly withdrawn taking circumferential  views of the entire colonic mucosa stopping only in the rectum which  appeared normal in direct and showed a hemorrhoid on retroflexed view. The  endoscope was straightened and withdrawn. The patient's vital signs and  pulse oximeter remained stable. The patient tolerated the procedure well  without apparent complications.   FINDINGS:  Internal hemorrhoids, otherwise, unremarkable exam.   PLAN:  See endoscopy note for further details.                                               Georgiana Spinner, M.D.    GMO/MEDQ  D:  12/02/2002  T:  12/02/2002  Job:  [id number]

## 2011-02-16 NOTE — Op Note (Signed)
   NAME:  [name], [name]                          ACCOUNT NO.:  [id number]   MEDICAL RECORD NO.:  [id number]                   PATIENT TYPE:  AMB   LOCATION:  ENDO                                 FACILITY:  [facility]   PHYSICIAN:  Georgiana Spinner, M.D.                 DATE OF BIRTH:  1946-07-20   DATE OF PROCEDURE:  12/02/2002  DATE OF DISCHARGE:                                 OPERATIVE REPORT   PROCEDURE:  Upper endoscopy.   INDICATIONS FOR PROCEDURE:  Gastroesophageal reflux disease.   ANESTHESIA:  Demerol 60, Versed 7 mg.   DESCRIPTION OF PROCEDURE:  With the patient mildly sedated in the left  lateral decubitus position, the Olympus videoscopic endoscope was inserted  in the mouth and passed under direct vision through the esophagus which  appeared normal until we reached the distal esophagus and there was clear  areas of esophagitis and some probable early stricture formation  photographed and biopsied. We entered into the stomach through a hiatal  hernia. The fundus, body, antrum, duodenal bulb and second portion of the  duodenum all appeared normal. From this point, the endoscope was slowly  withdrawn taking circumferential views of the entire duodenal mucosa until  the endoscope was then pulled back in the stomach, placed in retroflexion to  view the stomach from below. The endoscope was straightened and withdrawn  taking circumferential views of the remaining gastric and esophageal mucosa.  The patient's vital signs and pulse oximeter remained stable. The patient  tolerated the procedure well without apparent complications.   FINDINGS:  Hiatal hernia with distal esophagitis and probably early  stricture formation.   PLAN:  This patient needs to be on proton pump inhibitor and will start that  and have patient call me for results of biopsy and followup with me as an  outpatient.                                                 Georgiana Spinner, M.D.    GMO/MEDQ  D:   12/02/2002  T:  12/02/2002  Job:  [id number]

## 2011-02-16 NOTE — H&P (Signed)
[name], [name]                ACCOUNT NO.:  [id number]   MEDICAL RECORD NO.:  [id number]          PATIENT TYPE:  EMS   LOCATION:  ED                           FACILITY:  [facility]   PHYSICIAN:  Mobolaji B. Bakare, M.D.DATE OF BIRTH:  May 26, 1946   DATE OF ADMISSION:  04/09/2005  DATE OF DISCHARGE:                                HISTORY & PHYSICAL   PRIMARY CARE PHYSICIAN:  Dr. Phylliss Bob of [facility].   GASTROENTEROLOGIST:  Althea Grimmer. Luther Parody, M.D.   CARDIOLOGIST:  Antionette Char, M.D.   CHIEF COMPLAINT:  Epistaxis, left nostril, started five days ago.   HISTORY OF PRESENT ILLNESS:  [name] is a 65 year old Caucasian male with  recent hospitalization for septic shock, jaundice and coagulopathy.  During  that hospitalization he had non-ST elevation myocardial infarction and he  has been on aspirin with a beta blocker.   In the last five days he has been noticing bleeding from left nostril and  usually he bleeds about once a day and it responds to pressure.  However,  this morning, he did respond to application of pressure.   He used a piece of damp cloth into his nostrils because he was feeling dry  and upon removal of the piece of cloth he started bleeding and he stated  that he might have lost about a pint of blood.  He felt dizzy.  Upon arrival  in the emergency department his blood pressure was 80/56.  He also had a  bout of vomitus which contained blood clots.   His left nostril was packed in the emergency department and is currently not  bleeding.   REVIEW OF SYSTEMS:  No fever or chills. He has dry nose but no post nasal  drip.  No headaches.  No abdominal pain, diarrhea.  He did vomit once in the  emergency department.  No cough or shortness of breath, no choking.   PAST MEDICAL HISTORY:  1.  Recent hospitalization for Strep pyogenes bacteremia, probably secondary      to bacterial peritonitis.  2.  Non-ST elevation myocardial infarction.  3.   Septic shock.  4.  History of multi-organ failure during recent hospitalization in June      2006 including ventilator-dependent respiratory failure, acute renal      failure which required CVD/HD.  5.  Jaundice and elevated transaminases which were thought to be due to      hypertension on background of chronic liver disease.  6.  History of hepatitis C infection.  7.  Left lower extremity cellulitis.  8.  Coronary artery disease, status post coronary artery bypass grafting in      January 2005.  9.  Gastroesophageal reflux disorder and hiatal hernia.  10. Hyperlipidemia.  11. Psoriatic arthritis.  12. Diet controlled diabetes mellitus.  13. History of Staphylococcus bacteremia.   PAST SURGICAL HISTORY:  1.  Cholecystectomy.  2.  Appendectomy.  3.  Repair of left femoral fracture.  4.  Coronary artery bypass grafting, three vessels in January 2005.   CURRENT MEDICATIONS:  1.  Lopressor 25 mg  b.i.d.  2.  Aspirin 81 mg p.o. daily.   ALLERGIES:  No known drug allergies.   FAMILY HISTORY:  Noncontributory.   SOCIAL HISTORY:  The patient lives with his wife and is independent in his  activities of daily living.  He does not smoke cigarettes.  He does not  drink alcohol.   PHYSICAL EXAMINATION:  VITAL SIGNS:  Temperature 97.9, blood pressure  107/65, pulse 75, respiratory rate 16.  Oxygen saturation 99% on 2L.  GENERAL APPEARANCE:  Patient is jaundiced, pale, not in respiratory  distress.  HEENT:  He has a left nostril pack.  Oropharynx with no posterior nasal  bleeding noted.  NECK:  No elevated jugular venous distention.  No carotid bruit.  LUNGS:  Clear clinically to auscultation.  CARDIOVASCULAR:  S1, S2 regular, no murmurs, no gallops, no rubs.  ABDOMEN:  Not distended, soft, nontender.  Bowel sounds present.  EXTREMITIES:  Trace pitting edema.  No calf tenderness.  CNS:  Nonfocal.  There is asterixis.   LABORATORY DATA:  Initial laboratory data shows ammonia level 41.   Pro Time  17.9, INR 1.5, PTT 38.  Sodium 128, potassium 4.2, chloride 103, bicarb 18,  glucose 138, BUN 27, creatinine 1.7, bilirubin 17, alkaline phosphatase 448,  AST 283, ALT 91, total protein 7.2, albumin 1.0, calcium 7.5.  White cell  count 14.4, hemoglobin 11.1, hematocrit 31.4, MCV 91, platelet count  310,000.  Neutrophils 65%, lymphocytes 25%, monocytes 9%.  There is a mild  left shift with target cells, platelet clumps and 1 to 5 metas, occasional  bands.   ASSESSMENT AND PLAN:  [name] is a 65 year old white male with history of  chronic liver disease and recent Strep pyrogenes bacteremia presenting with  epistaxis, relative hypotension.   PROBLEM #1:  EPISTAXIS:  Left nostril is already packed.  Will transfuse  with one unit of packed red blood cells given the relative hypotension and  the quantity of blood he lost.  We will continue to hold his aspirin and  beta blocker until blood pressure normalizes and remains stable.  Will  prophylactically provide antibiotic p.o. daily to cover sinusitis in view of  the left nostril being packed.  Will use Augmentin, 875 mg p.o. q.12h.  I spoke to Dr. Serena Colonel, ENT surgeon.  He recommended keeping the nose  packed for five days and he would see the patient in the office in five days  time for cauterization.   PROBLEM #2:  CHRONIC LIVER DISEASE WITH CHOLESTATIC JAUNDICE,  HYPOALBUMINEMIA AND HYPONATREMIA:  Will avoid hepato-toxic medications.   PROBLEM #3:  MILD RENAL INSUFFICIENCY:  Probably pre-renal.  Will give  gentle hydration.   PROBLEM #4:  HISTORY OF CORONARY ARTERY DISEASE:  Patient is status post  coronary artery bypass grafting.   PROBLEM #5:  HISTORY OF HEPATITIS C INFECTION.       MBB/MEDQ  D:  04/09/2005  T:  04/09/2005  Job:  [id number]   cc:   Dr. Phylliss Bob, [facility]   Althea Grimmer. Luther Parody, M.D.  [address]  [place]  [place] [address] Fax: [phone]   Antionette Char, MD  [address]  [place]  [place] [address]  Fax: [phone]   Jeannett Senior. Pollyann Kennedy, MD  [phone] [place]  [place] [address]  Fax: [phone]

## 2011-02-16 NOTE — Op Note (Signed)
[name], [name]                ACCOUNT NO.:  [id number]   MEDICAL RECORD NO.:  [id number]          PATIENT TYPE:  INP   LOCATION:  0155                         FACILITY:  [facility]   PHYSICIAN:  Althea Grimmer. Santogade, M.D.DATE OF BIRTH:  01-29-1946   DATE OF PROCEDURE:  04/25/2005  DATE OF DISCHARGE:                                 OPERATIVE REPORT   PROCEDURE:  Endoscopic retrograde cholangiopancreatography with  sphincterotomy and stone extraction.   INDICATIONS:  Recurrent sepsis in a 65 year old male with abnormal  obstructive liver function tests which have been inadequately explained. It  was suspected that he had a distal common bile duct stricture based on MRI  of the ducts with liver biopsies showing cholangitis.   PREPARATION:  He is n.p.o. since midnight and has been on ceftriaxone.   PREPROCEDURE SEDATION:  Prior to and during the procedure, he received a  total of 87.5 mg of fentanyl, 9 mg of versed, Glucagon 1.5 mg. His throat  was anesthetized with Cetacaine spray, and he was on 2 liters of nasal  cannula O2.   PROCEDURE:  The Olympus video duodenoscope was passed through the oropharynx  and the upper esophageal sphincter with ease. Intubation was then carried  out to the descending duodenum. The scope was flexed and withdrawn, and the  ampulla was visualized. This was quite bulbous or edematous. The TriTome  Sphincterotome was advanced and easily cannulated the ampulla. Multiple  injections were made, and bile duct was minimally outlined, and there  appeared to be some submucosal tracking of dye. It was suspected there was a  distal stricture. After much manipulation with the radiologist in  attendance, the smaller guidewire was advanced into the common bile duct.  The catheter was advanced and further dye injections given. In the mid CBD,  it was suspected there was a stricture. A balloon catheter exchange with the  adjustable balloon was made, and this was passed  to the bifurcation which  was beyond the mid duct stricture. The latter appeared to be at the take off  of the cystic duct stump. The balloon was inflated to 12 mm and withdrawn.  What appeared to be a stricture was apparently a large stone in the mid  common bile duct. This was pulled down to the distal common bile duct,  partly fractured, and pulled through. It appeared to be dark pigmentary  material. Due to balloon rupture, a second 12-mm balloon catheter was  exchanged and advanced again and withdrawn through the common bile duct, at  this time delivering what appeared to be all of the residual stone. Further  dye injections reviewed with the radiologist did not demonstrate any  apparent fixed stricture, and there was no remaining residual debris in the  duct. The patient tolerated the procedure well. Pulse, blood pressure, and  oximetry testing remained stable throughout. He will be observed in recovery  for one hour and then sent back to his hospital room.   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 I suspect this biliary stone  was  responsible for his recurrent episodes of sepsis.   PLAN:  He will be observed in hospital for resolution of any signs of sepsis  and improvement in his LFTs.       PJS/MEDQ  D:  04/25/2005  T:  04/25/2005  Job:  [id number]   cc:   Areatha Keas, M.D.  [address]  [address]  [place]  [place] [address]  Fax: [phone]

## 2011-10-02 HISTORY — PX: OTHER SURGICAL HISTORY: SHX169

## 2012-02-22 ENCOUNTER — Emergency Department (HOSPITAL_COMMUNITY)
Admission: EM | Admit: 2012-02-22 | Discharge: 2012-02-23 | Disposition: A | Discharge status: Home / Self Care | Payer: Medicare Other | Attending: Emergency Medicine | Admitting: Emergency Medicine

## 2012-02-22 ENCOUNTER — Encounter (HOSPITAL_COMMUNITY): Payer: Self-pay | Admitting: Physical Medicine and Rehabilitation

## 2012-02-22 ENCOUNTER — Emergency Department (HOSPITAL_COMMUNITY): Payer: Medicare Other

## 2012-02-22 DIAGNOSIS — K5289 Other specified noninfective gastroenteritis and colitis: Secondary | ICD-10-CM | POA: Insufficient documentation

## 2012-02-22 DIAGNOSIS — Z951 Presence of aortocoronary bypass graft: Secondary | ICD-10-CM | POA: Insufficient documentation

## 2012-02-22 DIAGNOSIS — R1032 Left lower quadrant pain: Secondary | ICD-10-CM | POA: Insufficient documentation

## 2012-02-22 DIAGNOSIS — K7460 Unspecified cirrhosis of liver: Secondary | ICD-10-CM | POA: Insufficient documentation

## 2012-02-22 DIAGNOSIS — M542 Cervicalgia: Secondary | ICD-10-CM

## 2012-02-22 DIAGNOSIS — K529 Noninfective gastroenteritis and colitis, unspecified: Secondary | ICD-10-CM

## 2012-02-22 HISTORY — DX: Unspecified cirrhosis of liver: K74.60

## 2012-02-22 LAB — COMPREHENSIVE METABOLIC PANEL
ALT: 34 U/L (ref 0–53)
ALT: 34 U/L (ref 0–53)
AST: 65 U/L — ABNORMAL HIGH (ref 0–37)
AST: 68 U/L — ABNORMAL HIGH (ref 0–37)
Alkaline Phosphatase: 134 U/L — ABNORMAL HIGH (ref 39–117)
Alkaline Phosphatase: 144 U/L — ABNORMAL HIGH (ref 39–117)
CO2: 19 mEq/L (ref 19–32)
Calcium: 8.8 mg/dL (ref 8.4–10.5)
Chloride: 98 mEq/L (ref 96–112)
GFR calc Af Amer: >90 mL/min (ref >90)
GFR calc Af Amer: >90 mL/min (ref >90)
GFR calc non Af Amer: 86 mL/min — ABNORMAL LOW (ref >90)
Glucose, Bld: 138 mg/dL — ABNORMAL HIGH (ref 70–99)
Glucose, Bld: 148 mg/dL — ABNORMAL HIGH (ref 70–99)
Potassium: 4.5 mEq/L (ref 3.5–5.1)
Potassium: 4.7 mEq/L (ref 3.5–5.1)
Sodium: 128 mEq/L — ABNORMAL LOW (ref 135–145)
Sodium: 129 mEq/L — ABNORMAL LOW (ref 135–145)
Total Protein: 7 g/dL (ref 6.0–8.3)

## 2012-02-22 LAB — CBC
Hemoglobin: 13.3 g/dL (ref 13.0–17.0)
MCH: 34.3 pg — ABNORMAL HIGH (ref 26.0–34.0)
MCH: 33.9 pg (ref 26.0–34.0)
MCHC: 36 g/dL (ref 30.0–36.0)
MCHC: 35.9 g/dL (ref 30.0–36.0)
Platelets: 47 10*3/uL — ABNORMAL LOW (ref 150–400)
RBC: 3.92 MIL/uL — ABNORMAL LOW (ref 4.22–5.81)
RDW: 13.9 % (ref 11.5–15.5)

## 2012-02-22 LAB — DIFFERENTIAL
Basophils Absolute: 0 10*3/uL (ref 0.0–0.1)
Basophils Relative: 1 % (ref 0–1)
Eosinophils Absolute: 0.1 10*3/uL (ref 0.0–0.7)
Monocytes Absolute: 0.8 10*3/uL (ref 0.1–1.0)
Neutro Abs: 4.1 10*3/uL (ref 1.7–7.7)
Neutrophils Relative %: 69 % (ref 43–77)

## 2012-02-22 LAB — URINE MICROSCOPIC-ADD ON

## 2012-02-22 LAB — URINALYSIS, ROUTINE W REFLEX MICROSCOPIC
Glucose, UA: NEGATIVE mg/dL
Specific Gravity, Urine: 1.015 (ref 1.005–1.030)
pH: 5 (ref 5.0–8.0)

## 2012-02-22 MED ORDER — DIAZEPAM 5 MG/ML IJ SOLN
5.0000 mg | Freq: Once | INTRAMUSCULAR | Status: AC
  Administered 2012-02-22: 5 mg via INTRAVENOUS
  Filled 2012-02-22: qty 2

## 2012-02-22 MED ORDER — IOHEXOL 300 MG/ML  SOLN
100.0000 mL | Freq: Once | INTRAMUSCULAR | Status: AC | PRN
  Administered 2012-02-22: 100 mL via INTRAVENOUS

## 2012-02-22 MED ORDER — IOHEXOL 300 MG/ML  SOLN
20.0000 mL | INTRAMUSCULAR | Status: AC
  Administered 2012-02-22: 20 mL via ORAL

## 2012-02-22 MED ORDER — SODIUM CHLORIDE 0.9 % IV BOLUS (SEPSIS)
1000.0000 mL | Freq: Once | INTRAVENOUS | Status: AC
  Administered 2012-02-22: 1000 mL via INTRAVENOUS

## 2012-02-22 NOTE — ED Notes (Signed)
Notified CT that pt finished contrast 

## 2012-02-22 NOTE — ED Provider Notes (Signed)
History     CSN: [id number]  Arrival date & time 02/22/12  1713   First MD Initiated Contact with Patient 02/22/12 1823     7:15 PM HPI Patient is here for left-sided abdominal pain that began this morning. Reports pain is severe to the point that he is unable to touch his abdomen. Denies associated nausea, vomiting, diarrhea, back pain, dysuria, hematuria. Patient has a significant history of liver disease due to methotrexate that he used several years ago. He is a candidate pending liver transplant. Patient is also complaining of bilateral neck pain. Reports pain is worse with certain movements. States he woke up with pain this morning. Denies tenderness. Denies fever.  Patient is a 66 y.o. male presenting with abdominal pain. The history is provided by the patient.  Abdominal Pain The primary symptoms of the illness include abdominal pain. The primary symptoms of the illness do not include fever, shortness of breath, nausea, vomiting, diarrhea or dysuria. The current episode started 13 to 24 hours ago. The problem has been gradually worsening.  The patient has not had a change in bowel habit. Symptoms associated with the illness do not include chills, anorexia, diaphoresis, heartburn, constipation, urgency, hematuria, frequency or back pain. Significant associated medical issues include liver disease.    Past Medical History  Diagnosis Date  . Cirrhosis     Past Surgical History  Procedure Date  . Coronary artery bypass graft     History reviewed. No pertinent family history.  History  Substance Use Topics  . Smoking status: Never Smoker   . Smokeless tobacco: Not on file  . Alcohol Use: No      Review of Systems  Constitutional: Negative for fever, chills and diaphoresis.  HENT: Positive for neck pain. Negative for congestion, sore throat, rhinorrhea, neck stiffness and postnasal drip.   Respiratory: Negative for cough and shortness of breath.   Cardiovascular: Negative  for chest pain.  Gastrointestinal: Positive for abdominal pain. Negative for heartburn, nausea, vomiting, diarrhea, constipation, blood in stool, rectal pain and anorexia.  Genitourinary: Negative for dysuria, urgency, frequency, hematuria, flank pain, discharge, penile pain and testicular pain.  Musculoskeletal: Negative for back pain.  Neurological: Negative for dizziness, weakness, numbness and headaches.  All other systems reviewed and are negative.    Allergies  Review of patient's allergies indicates not on file.  Home Medications  No current outpatient prescriptions on file.  BP 93/59  Pulse 72  Temp(Src) 98.8 F (37.1 C) (Oral)  Resp 18  SpO2 96%  Physical Exam  Constitutional: He is oriented to person, place, and time. He appears well-developed and well-nourished.  HENT:  Head: Normocephalic and atraumatic.  Nose: Nose normal.  Mouth/Throat: Uvula is midline, oropharynx is clear and moist and mucous membranes are normal.  Eyes: Conjunctivae are normal. Pupils are equal, round, and reactive to light.  Neck: Normal range of motion. Neck supple. Muscular tenderness present. No spinous process tenderness present. No rigidity.    Cardiovascular: Normal rate, regular rhythm and normal heart sounds.   Pulmonary/Chest: Effort normal and breath sounds normal.  Abdominal: Soft. Bowel sounds are normal. He exhibits no distension and no mass. There is no hepatosplenomegaly. There is tenderness in the left lower quadrant. There is no rigidity, no rebound, no guarding and no CVA tenderness.    Neurological: He is alert and oriented to person, place, and time.  Skin: Skin is warm and dry. No rash noted. No erythema. No pallor.  Psychiatric: He has a  normal mood and affect. His behavior is normal.    ED Course  Procedures  Results for orders placed during the hospital encounter of 02/22/12  URINALYSIS, ROUTINE W REFLEX MICROSCOPIC      Component Value Range   Color, Urine  YELLOW  YELLOW    APPearance CLEAR  CLEAR    Specific Gravity, Urine 1.015  1.005 - 1.030    pH 5.0  5.0 - 8.0    Glucose, UA NEGATIVE  NEGATIVE (mg/dL)   Hgb urine dipstick MODERATE (*) NEGATIVE    Bilirubin Urine NEGATIVE  NEGATIVE    Ketones, ur NEGATIVE  NEGATIVE (mg/dL)   Protein, ur NEGATIVE  NEGATIVE (mg/dL)   Urobilinogen, UA 0.2  0.0 - 1.0 (mg/dL)   Nitrite NEGATIVE  NEGATIVE    Leukocytes, UA NEGATIVE  NEGATIVE   COMPREHENSIVE METABOLIC PANEL      Component Value Range   Sodium 129 (*) 135 - 145 (mEq/L)   Potassium 4.5  3.5 - 5.1 (mEq/L)   Chloride 98  96 - 112 (mEq/L)   CO2 19  19 - 32 (mEq/L)   Glucose, Bld 148 (*) 70 - 99 (mg/dL)   BUN 17  6 - 23 (mg/dL)   Creatinine, Ser 4.09  0.50 - 1.35 (mg/dL)   Calcium 8.9  8.4 - 81.1 (mg/dL)   Total Protein 7.1  6.0 - 8.3 (g/dL)   Albumin 2.7 (*) 3.5 - 5.2 (g/dL)   AST 68 (*) 0 - 37 (U/L)   ALT 34  0 - 53 (U/L)   Alkaline Phosphatase 144 (*) 39 - 117 (U/L)   Total Bilirubin 5.4 (*) 0.3 - 1.2 (mg/dL)   GFR calc non Af Amer 86 (*) >90 (mL/min)   GFR calc Af Amer >90  >90 (mL/min)  CBC      Component Value Range   WBC 5.9  4.0 - 10.5 (K/uL)   RBC 4.00 (*) 4.22 - 5.81 (MIL/uL)   Hemoglobin 13.7  13.0 - 17.0 (g/dL)   HCT 91.4 (*) 78.2 - 52.0 (%)   MCV 95.3  78.0 - 100.0 (fL)   MCH 34.3 (*) 26.0 - 34.0 (pg)   MCHC 36.0  30.0 - 36.0 (g/dL)   RDW 95.6  21.3 - 08.6 (%)   Platelets 50 (*) 150 - 400 (K/uL)  DIFFERENTIAL      Component Value Range   Neutrophils Relative 69  43 - 77 (%)   Neutro Abs 4.1  1.7 - 7.7 (K/uL)   Lymphocytes Relative 15  12 - 46 (%)   Lymphs Abs 0.9  0.7 - 4.0 (K/uL)   Monocytes Relative 13 (*) 3 - 12 (%)   Monocytes Absolute 0.8  0.1 - 1.0 (K/uL)   Eosinophils Relative 2  0 - 5 (%)   Eosinophils Absolute 0.1  0.0 - 0.7 (K/uL)   Basophils Relative 1  0 - 1 (%)   Basophils Absolute 0.0  0.0 - 0.1 (K/uL)  CBC      Component Value Range   WBC 6.6  4.0 - 10.5 (K/uL)   RBC 3.92 (*) 4.22 - 5.81  (MIL/uL)   Hemoglobin 13.3  13.0 - 17.0 (g/dL)   HCT 57.8 (*) 46.9 - 52.0 (%)   MCV 94.4  78.0 - 100.0 (fL)   MCH 33.9  26.0 - 34.0 (pg)   MCHC 35.9  30.0 - 36.0 (g/dL)   RDW 62.9  52.8 - 41.3 (%)   Platelets 47 (*) 150 - 400 (K/uL)  COMPREHENSIVE METABOLIC PANEL      Component Value Range   Sodium 128 (*) 135 - 145 (mEq/L)   Potassium 4.7  3.5 - 5.1 (mEq/L)   Chloride 98  96 - 112 (mEq/L)   CO2 19  19 - 32 (mEq/L)   Glucose, Bld 138 (*) 70 - 99 (mg/dL)   BUN 17  6 - 23 (mg/dL)   Creatinine, Ser 8.11  0.50 - 1.35 (mg/dL)   Calcium 8.8  8.4 - 91.4 (mg/dL)   Total Protein 7.0  6.0 - 8.3 (g/dL)   Albumin 2.7 (*) 3.5 - 5.2 (g/dL)   AST 65 (*) 0 - 37 (U/L)   ALT 34  0 - 53 (U/L)   Alkaline Phosphatase 134 (*) 39 - 117 (U/L)   Total Bilirubin 5.9 (*) 0.3 - 1.2 (mg/dL)   GFR calc non Af Amer 85 (*) >90 (mL/min)   GFR calc Af Amer >90  >90 (mL/min)  LIPASE, BLOOD      Component Value Range   Lipase 35  11 - 59 (U/L)  URINE MICROSCOPIC-ADD ON      Component Value Range   Squamous Epithelial / LPF RARE  RARE    WBC, UA 0-2  <3 (WBC/hpf)   RBC / HPF 0-2  <3 (RBC/hpf)   Bacteria, UA RARE  RARE    Casts HYALINE CASTS (*) NEGATIVE    Urine-Other MUCOUS PRESENT     Ct Abdomen Pelvis W Contrast  02/22/2012  *RADIOLOGY REPORT*  Clinical Data: Left-sided abdominal pain and chills since this morning.  History of hepatic cirrhosis.  Methotrexate.  Awaiting liver transplant.  CT ABDOMEN AND PELVIS WITH CONTRAST  Technique:  Multidetector CT imaging of the abdomen and pelvis was performed following the standard protocol during bolus administration of intravenous contrast.  Contrast: OMNIPAQUE IOHEXOL 300 MG/ML  SOLN  Comparison: 12/11/2010  Findings: Small right pleural effusion with bilateral basilar atelectasis, new since previous study.  Coronary artery calcifications.  Small esophageal hiatal hernia.  Enlarged lateral segment left lobe of the liver with mild contour deformity of the liver  consistent with cirrhosis.  There are prominent varices in the gastrohepatic ligament, around the splenic vein, and around the distal esophagus.  The spleen is enlarged. Flow is demonstrated in the portal vein and in the mesenteric artery and vein.  Recanalization of the periumbilical veins with periumbilical varices.  Surgical absence of the gallbladder.  No bile duct dilatation.  The stomach is decompressed and cannot be evaluated for thickness. Kidneys and adrenal glands are unremarkable.  Calcification of the abdominal aorta without aneurysm.  There is a minimal free fluid in the pericolic gutters, representing significant interval improvement of ascites since previous study.  Small bowel are not distended.  Stool filled colon.  There is a suggestion of inflammatory change and wall thickening around the cecum and ascending colon which could represent inflammatory process such as colitis or could be related to ascites.  No free air in the abdomen.  Pelvis:  The bladder wall is not thickened.  Prostate gland is not enlarged.  Diverticula in the sigmoid colon without inflammatory change.  The appendix is surgically absent.  Postoperative changes in the left proximal femur.  No free or loculated pelvic fluid collections.  Degenerative changes in the lumbar spine. Spondylolysis and spondylolisthesis at L5-S1.  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  Improved ascites since previous study.  New development of small right  pleural effusion with bilateral basilar atelectasis.  Wall thickening and inflammation involving the cecum and ascending colon could represent focal colitis or inflammatory process.  Original Report Authenticated By: Marlon Pel, M.D.     MDM   9:16 PM Patient reports improved pain in neck and abdomen with diazepam. However these have left lower quadrant tenderness with palpation. Patient unless  CT scan diverticulosis. Will order a CT scan to rule out diverticulitis.  12:41 AM Patient's CT indicates colitis of the cecum and descending colon. Will treat with Cipro and Flagyl. Advised followup with primary care physician. Return to emergency department for worsening symptoms. Patient and family voiced understanding. And will be ready for discharge after IV antibiotics. Patient continues to have no complaints of pain after one dose of Valium. Pt signed out to Dr. Ranae Palms pending completion of antibiotics.      Thomasene Lot, PA-C 02/23/12 0110

## 2012-02-22 NOTE — ED Notes (Addendum)
Pt.. Reports neck stiffness since last night. Since last night.  Reports chills since this morning. Pain in left side. Non tender to palpation. Pt. Reports weakness "all over". Pt. Denies fever. Pt. Denies SOB. Wife reports no change in mental status. A.O. X 4. NAD. Wife at bedside.

## 2012-02-22 NOTE — ED Notes (Signed)
Pt presents to department for evaluation of neck pain/stiffness, L sided abdominal pain and chills. Onset this morning. 8/10 pain at the time. No nausea/vomiting. He is conscious alert and oriented x4. No signs of acute distress at the time.

## 2012-02-23 MED ORDER — DIAZEPAM 5 MG PO TABS: 5.0000 mg | ORAL_TABLET | Freq: Three times a day (TID) | ORAL | Status: AC | PRN

## 2012-02-23 MED ORDER — METRONIDAZOLE 500 MG PO TABS: 500.0000 mg | ORAL_TABLET | Freq: Two times a day (BID) | ORAL | Status: AC

## 2012-02-23 MED ORDER — METRONIDAZOLE IN NACL 5-0.79 MG/ML-% IV SOLN
500.0000 mg | Freq: Once | INTRAVENOUS | Status: AC
  Administered 2012-02-23: 500 mg via INTRAVENOUS
  Filled 2012-02-23: qty 100

## 2012-02-23 MED ORDER — CIPROFLOXACIN HCL 500 MG PO TABS: 500.0000 mg | ORAL_TABLET | Freq: Two times a day (BID) | ORAL | Status: AC

## 2012-02-23 MED ORDER — CIPROFLOXACIN IN D5W 400 MG/200ML IV SOLN
400.0000 mg | Freq: Once | INTRAVENOUS | Status: AC
  Administered 2012-02-23: 400 mg via INTRAVENOUS
  Filled 2012-02-23: qty 200

## 2012-02-23 NOTE — Discharge Instructions (Signed)
Colitis Colitis is inflammation of the colon. Colitis can be a short-term or long-standing (chronic) illness. Crohn's disease and ulcerative colitis are 2 types of colitis which are chronic. They usually require lifelong treatment. CAUSES  There are many different causes of colitis, including:  Viruses.   Germs (bacteria).   Medicine reactions.  SYMPTOMS   Diarrhea.   Intestinal bleeding.   Pain.   Fever.   Throwing up (vomiting).   Tiredness (fatigue).   Weight loss.   Bowel blockage.  DIAGNOSIS  The diagnosis of colitis is based on examination and stool or blood tests. X-rays, CT scan, and colonoscopy may also be needed. TREATMENT  Treatment may include:  Fluids given through the vein (intravenously).   Bowel rest (nothing to eat or drink for a period of time).   Medicine for pain and diarrhea.   Medicines (antibiotics) that kill germs.   Cortisone medicines.   Surgery.  HOME CARE INSTRUCTIONS   Get plenty of rest.   Drink enough water and fluids to keep your urine clear or pale yellow.   Eat a well-balanced diet.   Call your caregiver for follow-up as recommended.  SEEK IMMEDIATE MEDICAL CARE IF:   You develop chills.   You have an oral temperature above 102 F (38.9 C), not controlled by medicine.   You have extreme weakness, fainting, or dehydration.   You have repeated vomiting.   You develop severe belly (abdominal) pain or are passing bloody or tarry stools.  MAKE SURE YOU:   Understand these instructions.   Will watch your condition.   Will get help right away if you are not doing well or get worse.  Document Released: 10/25/2004 Document Revised: 09/06/2011 Document Reviewed: 01/20/2010 [employer] Patient Information 2012 [employer]. 

## 2012-02-24 NOTE — ED Provider Notes (Signed)
Medical screening examination/treatment/procedure(s) were conducted as a shared visit with non-physician practitioner(s) and myself.  I personally evaluated the patient during the encounter This elderly male presents with concerns of abdominal pain and neck tightness.  On exam he is in no distress.  Patient's evaluation is notable for the demonstration of colitis.  He received antibiotics and was discharged.  Gerhard Munch, MD 02/24/12 [phone]

## 2012-10-07 IMAGING — CT CT HEAD W/O CM
1 of 4 series · 11 of 30 positions shown, 14 images · non-contrast
Comparison: Maxillofacial CT 12/04/2008

CLINICAL DATA: Confusion

CT HEAD WITHOUT CONTRAST
TECHNIQUE: Contiguous axial images were obtained from the base of
the skull through the vertex without contrast.

[Series 3: recon 2: brain · axial · 0.47mm/px · z∈[+162,+298]mm · 11 of 64 slices shown, 14 images]
[im 6/64  brain]
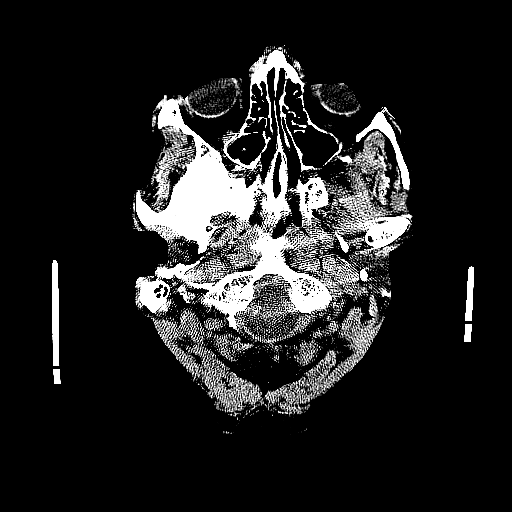
[im 6/64  bone]
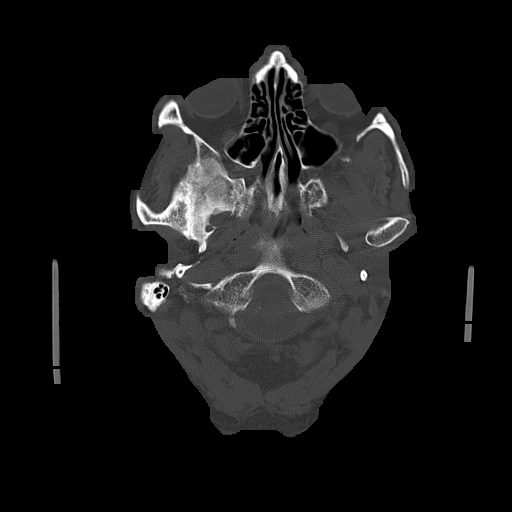
[im 11/64  brain]
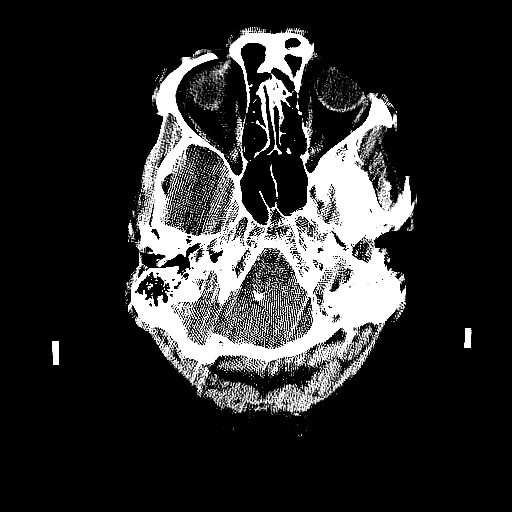
[im 16/64  brain]
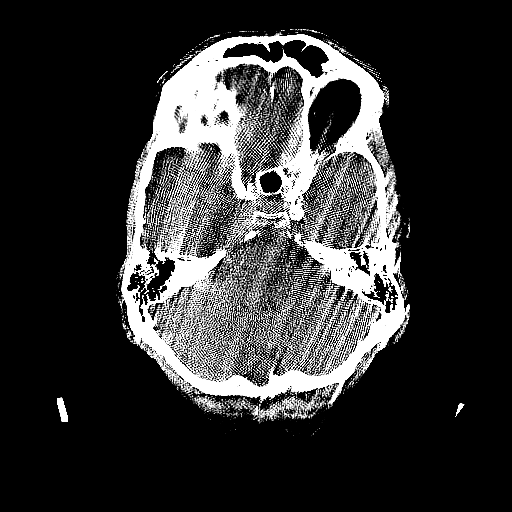
[im 22/64  brain]
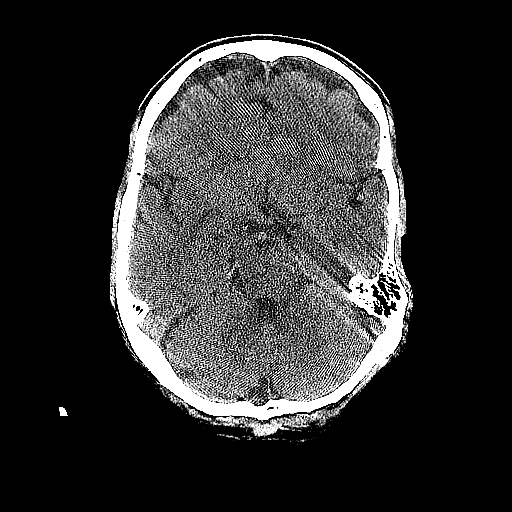
[im 27/64  brain]
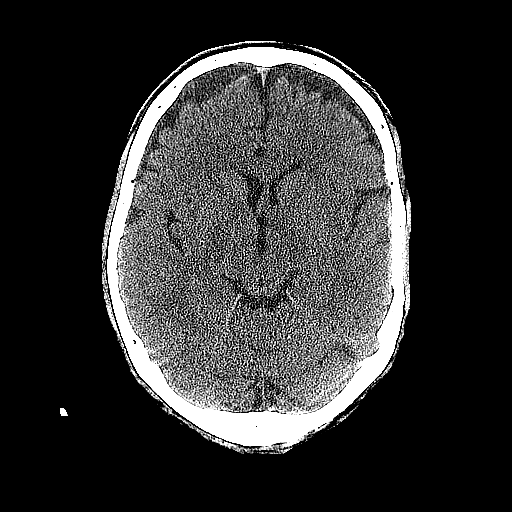
[im 27/64  bone]
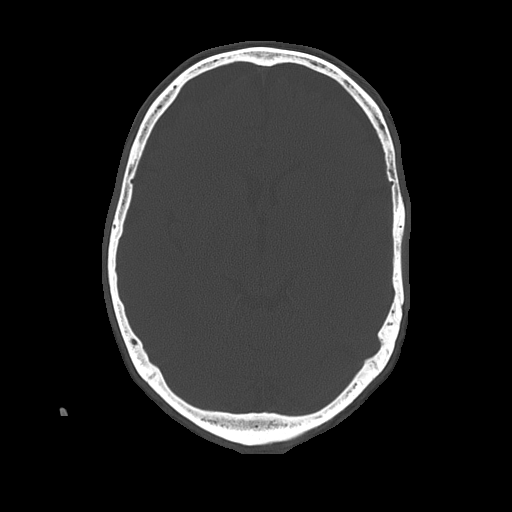
[im 32/64  brain]
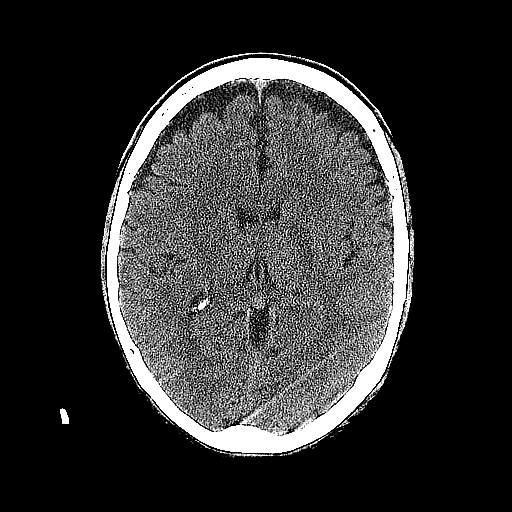
[im 37/64  brain]
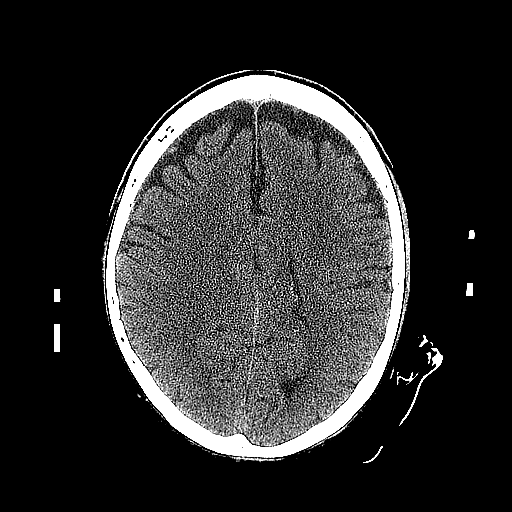
[im 43/64  brain]
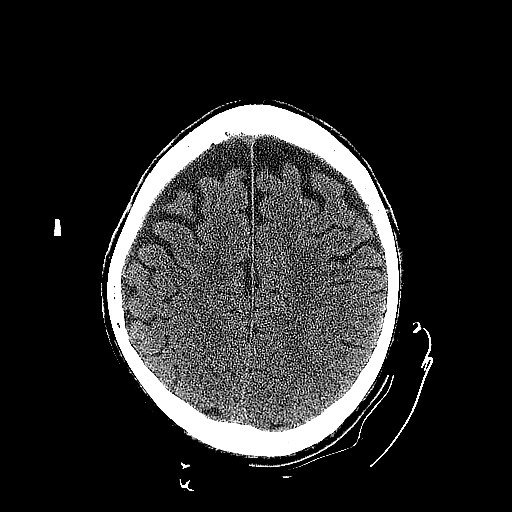
[im 48/64  brain]
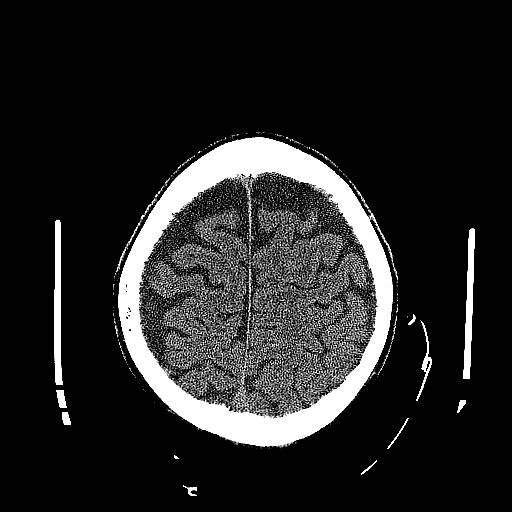
[im 48/64  bone]
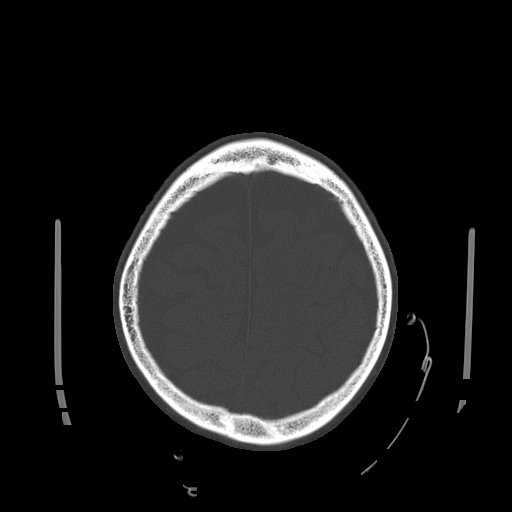
[im 53/64  brain]
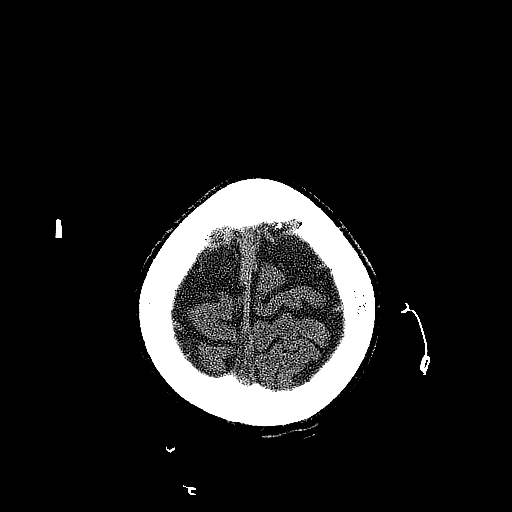
[im 58/64  brain]
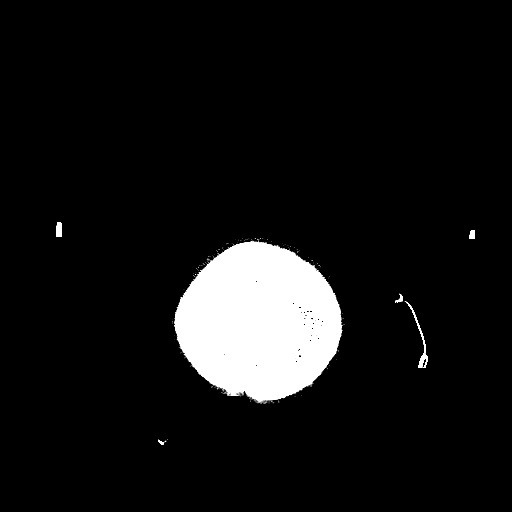

[11 of 30 positions shown; findings below may reference images not displayed]

FINDINGS: Ventricular size within normal limits.  There is frontal
lobe atrophy bilaterally.   No evidence for acute infarct,
hemorrhage, or mass lesion. No extra-axial fluid collections or
midline shift.  Calvarium intact.  No fluid in the sinuses
visualized.

There is an ectatic and heavily calcified right vertebral artery
noted at the base of the brain.  This same finding was present on
the prior maxillofacial study.
IMPRESSION: 1.  Bilateral frontal lobe atrophy.
2.  No definite acute or focal intracranial abnormality.
3.  There is an ectatic, heavily calcified right vertebral artery.
This was present on the prior study dated 12/04/2008.

## 2016-12-21 ENCOUNTER — Other Ambulatory Visit: Payer: Self-pay | Admitting: Gastroenterology

## 2016-12-24 ENCOUNTER — Encounter (HOSPITAL_COMMUNITY): Payer: Self-pay | Admitting: *Deleted

## 2016-12-25 NOTE — Progress Notes (Signed)
Requested lastest chest xray from [facility] 04-23-16 on 12-25-15, by fax, none received.

## 2016-12-27 ENCOUNTER — Ambulatory Visit (HOSPITAL_COMMUNITY): Payer: Medicare Other | Admitting: Anesthesiology

## 2016-12-27 ENCOUNTER — Ambulatory Visit (HOSPITAL_COMMUNITY)
Admission: RE | Admit: 2016-12-27 | Discharge: 2016-12-27 | Disposition: A | Discharge status: Home / Self Care | Payer: Medicare Other | Source: Ambulatory Visit | Attending: Gastroenterology | Admitting: Gastroenterology

## 2016-12-27 ENCOUNTER — Encounter (HOSPITAL_COMMUNITY)
Admission: RE | Disposition: A | Discharge status: Home / Self Care | Payer: Self-pay | Source: Ambulatory Visit | Attending: Gastroenterology

## 2016-12-27 ENCOUNTER — Encounter (HOSPITAL_COMMUNITY): Payer: Self-pay | Admitting: *Deleted

## 2016-12-27 DIAGNOSIS — K635 Polyp of colon: Secondary | ICD-10-CM | POA: Diagnosis not present

## 2016-12-27 DIAGNOSIS — E119 Type 2 diabetes mellitus without complications: Secondary | ICD-10-CM | POA: Diagnosis not present

## 2016-12-27 DIAGNOSIS — K219 Gastro-esophageal reflux disease without esophagitis: Secondary | ICD-10-CM | POA: Diagnosis not present

## 2016-12-27 DIAGNOSIS — Z1211 Encounter for screening for malignant neoplasm of colon: Secondary | ICD-10-CM

## 2016-12-27 DIAGNOSIS — K640 First degree hemorrhoids: Secondary | ICD-10-CM | POA: Insufficient documentation

## 2016-12-27 DIAGNOSIS — M1990 Unspecified osteoarthritis, unspecified site: Secondary | ICD-10-CM | POA: Diagnosis not present

## 2016-12-27 DIAGNOSIS — I2510 Atherosclerotic heart disease of native coronary artery without angina pectoris: Secondary | ICD-10-CM | POA: Diagnosis not present

## 2016-12-27 HISTORY — DX: Unspecified osteoarthritis, unspecified site: M19.90

## 2016-12-27 HISTORY — DX: Psoriasis, unspecified: L40.9

## 2016-12-27 HISTORY — DX: Atherosclerotic heart disease of native coronary artery without angina pectoris: I25.10

## 2016-12-27 HISTORY — DX: Type 2 diabetes mellitus without complications: E11.9

## 2016-12-27 HISTORY — PX: COLONOSCOPY WITH PROPOFOL: SHX5780

## 2016-12-27 HISTORY — DX: Gastro-esophageal reflux disease without esophagitis: K21.9

## 2016-12-27 LAB — GLUCOSE, CAPILLARY
GLUCOSE-CAPILLARY: 88 mg/dL (ref 65–99)
GLUCOSE-CAPILLARY: 83 mg/dL (ref 65–99)

## 2016-12-27 SURGERY — COLONOSCOPY WITH PROPOFOL
Anesthesia: Monitor Anesthesia Care

## 2016-12-27 MED ORDER — PROPOFOL 500 MG/50ML IV EMUL
INTRAVENOUS | Status: DC | PRN
  Administered 2016-12-27: 140 ug/kg/min via INTRAVENOUS

## 2016-12-27 MED ORDER — LACTATED RINGERS IV SOLN
INTRAVENOUS | Status: DC
  Administered 2016-12-27: 1000 mL via INTRAVENOUS

## 2016-12-27 MED ORDER — LIDOCAINE 2% (20 MG/ML) 5 ML SYRINGE
INTRAMUSCULAR | Status: AC
  Filled 2016-12-27: qty 5

## 2016-12-27 MED ORDER — PROPOFOL 10 MG/ML IV BOLUS
INTRAVENOUS | Status: AC
  Filled 2016-12-27: qty 20

## 2016-12-27 MED ORDER — PROPOFOL 10 MG/ML IV BOLUS
INTRAVENOUS | Status: AC
  Filled 2016-12-27: qty 40

## 2016-12-27 MED ORDER — PROPOFOL 10 MG/ML IV BOLUS
INTRAVENOUS | Status: DC | PRN
  Administered 2016-12-27 (×2): 20 mg via INTRAVENOUS

## 2016-12-27 MED ORDER — SODIUM CHLORIDE 0.9 % IV SOLN: INTRAVENOUS | Status: DC

## 2016-12-27 SURGICAL SUPPLY — 21 items

## 2016-12-27 NOTE — Discharge Instructions (Addendum)
Will call you with the pathology results when they are complete.   YOU HAD AN ENDOSCOPIC PROCEDURE TODAY: Refer to the procedure report and other information in the discharge instructions given to you for any specific questions about what was found during the examination. If this information does not answer your questions, please call [facility] office at [phone] to clarify.   YOU SHOULD EXPECT: Some feelings of bloating in the abdomen. Passage of more gas than usual. Walking can help get rid of the air that was put into your GI tract during the procedure and reduce the bloating. If you had a lower endoscopy (such as a colonoscopy or flexible sigmoidoscopy) you may notice spotting of blood in your stool or on the toilet paper. Some abdominal soreness may be present for a day or two, also.  DIET: Your first meal following the procedure should be a light meal and then it is ok to progress to your normal diet. A half-sandwich or bowl of soup is an example of a good first meal. Heavy or fried foods are harder to digest and may make you feel nauseous or bloated. Drink plenty of fluids but you should avoid alcoholic beverages for 24 hours. If you had a esophageal dilation, please see attached instructions for diet.   ACTIVITY: Your care partner should take you home directly after the procedure. You should plan to take it easy, moving slowly for the rest of the day. You can resume normal activity the day after the procedure however YOU SHOULD NOT DRIVE, use power tools, machinery or perform tasks that involve climbing or major physical exertion for 24 hours (because of the sedation medicines used during the test).   SYMPTOMS TO REPORT IMMEDIATELY: A gastroenterologist can be reached at any hour. Please call [phone]  for any of the following symptoms:  Following lower endoscopy (colonoscopy, flexible sigmoidoscopy) Excessive amounts of blood in the stool  Significant tenderness, worsening of abdominal  pains  Swelling of the abdomen that is new, acute  Fever of 100 or higher  Following upper endoscopy (EGD, EUS, ERCP, esophageal dilation) Vomiting of blood or coffee ground material  New, significant abdominal pain  New, significant chest pain or pain under the shoulder blades  Painful or persistently difficult swallowing  New shortness of breath  Black, tarry-looking or red, bloody stools  FOLLOW UP:  If any biopsies were taken you will be contacted by phone or by letter within the next 1-3 weeks. Call [phone]  if you have not heard about the biopsies in 3 weeks.  Please also call with any specific questions about appointments or follow up tests.

## 2016-12-27 NOTE — Anesthesia Postprocedure Evaluation (Addendum)
Anesthesia Post Note  Patient: [name]  Procedure(s) Performed: Procedure(s) (LRB): COLONOSCOPY WITH PROPOFOL (N/A)  Patient location during evaluation: Endoscopy Anesthesia Type: MAC Level of consciousness: awake and alert Pain management: pain level controlled Vital Signs Assessment: post-procedure vital signs reviewed and stable Respiratory status: spontaneous breathing, nonlabored ventilation, respiratory function stable and patient connected to nasal cannula oxygen Cardiovascular status: stable and blood pressure returned to baseline Anesthetic complications: no       Last Vitals:  Vitals:   12/27/16 1030 12/27/16 1040  BP: 111/74 (!) 154/89  Pulse: 64 67  Resp: 13 13  Temp:      Last Pain:  Vitals:   12/27/16 0858  TempSrc: Oral                 Megan Hayduk,JAMES TERRILL

## 2016-12-27 NOTE — H&P (Signed)
Date of Initial H&P: 12/19/16  History reviewed, patient examined, no change in status, stable for surgery.  

## 2016-12-27 NOTE — Transfer of Care (Signed)
Immediate Anesthesia Transfer of Care Note  Patient: [name]  Procedure(s) Performed: Procedure(s): COLONOSCOPY WITH PROPOFOL (N/A)  Patient Location: Endoscopy Unit  Anesthesia Type:MAC  Level of Consciousness: sedated  Airway & Oxygen Therapy: Patient Spontanous Breathing and Patient connected to face mask oxygen  Post-op Assessment: Report given to RN and Post -op Vital signs reviewed and stable  Post vital signs: Reviewed and stable  Last Vitals:  Vitals:   12/27/16 0858  BP: 128/85  Resp: 19  Temp: 36.5 C    Last Pain:  Vitals:   12/27/16 0858  TempSrc: Oral         Complications: No apparent anesthesia complications

## 2016-12-27 NOTE — Op Note (Signed)
[facility] Patient Name: [name] Procedure Date: 12/27/2016 MRN: [id number] Attending MD: Shirley Friar , MD Date of Birth: 1946-07-09 CSN: [id number] Age: 71 Admit Type: Outpatient Procedure:                Colonoscopy Indications:              Screening for colorectal malignant neoplasm, Last                            colonoscopy: March 2004 Providers:                Shirley Friar, MD, Tillie Fantasia, RN, Beryle Beams, Technician, Leroy Libman, CRNA Referring MD:              Medicines:                Propofol per Anesthesia, Monitored Anesthesia Care Complications:            No immediate complications. Estimated Blood Loss:     Estimated blood loss: none. Procedure:                Pre-Anesthesia Assessment:                           - Prior to the procedure, a History and Physical                            was performed, and patient medications and                            allergies were reviewed. The patient's tolerance of                            previous anesthesia was also reviewed. The risks                            and benefits of the procedure and the sedation                            options and risks were discussed with the patient.                            All questions were answered, and informed consent                            was obtained. Prior Anticoagulants: The patient has                            taken no previous anticoagulant or antiplatelet                            agents. ASA Grade Assessment: III - A patient with  severe systemic disease. After reviewing the risks                            and benefits, the patient was deemed in                            satisfactory condition to undergo the procedure.                           After obtaining informed consent, the colonoscope                            was passed under direct vision. Throughout the                             procedure, the patient's blood pressure, pulse, and                            oxygen saturations were monitored continuously. The                            EC-[id number] ([id number]) scope was introduced through                            the anus and advanced to the the cecum, identified                            by appendiceal orifice and ileocecal valve. The                            colonoscopy was somewhat difficult due to a                            tortuous colon and fair prep. Successful completion                            of the procedure was aided by straightening and                            shortening the scope to obtain bowel loop reduction                            and lavage. The patient tolerated the procedure                            well. The quality of the bowel preparation was                            fair. The terminal ileum, ileocecal valve,                            appendiceal orifice, and rectum were photographed. Scope In: 9:47:56 AM Scope Out: 10:05:10 AM Scope Withdrawal Time: 0 hours 16 minutes 2  seconds  Total Procedure Duration: 0 hours 17 minutes 14 seconds  Findings:      The perianal and digital rectal examinations were normal.      A 5 mm polyp was found in the sigmoid colon. The polyp was sessile. The       polyp was removed with a hot snare. Resection and retrieval were       complete. Estimated blood loss: none.      A 4 mm polyp was found in the sigmoid colon. The polyp was semi-sessile.       The polyp was removed with a hot snare. Resection and retrieval were       complete. Estimated blood loss: none.      Internal hemorrhoids were found during retroflexion. The hemorrhoids       were medium-sized and Grade I (internal hemorrhoids that do not       prolapse).      The terminal ileum appeared normal. Impression:               - Preparation of the colon was fair.                           - One 5 mm polyp in the  sigmoid colon, removed with                            a hot snare. Resected and retrieved.                           - One 4 mm polyp in the sigmoid colon, removed with                            a hot snare. Resected and retrieved.                           - Internal hemorrhoids.                           - The examined portion of the ileum was normal. Moderate Sedation:      N/A- Per Anesthesia Care Recommendation:           - Patient has a contact number available for                            emergencies. The signs and symptoms of potential                            delayed complications were discussed with the                            patient. Return to normal activities tomorrow.                            Written discharge instructions were provided to the                            patient.                           -  Resume previous diet.                           - Await pathology results.                           - No ibuprofen, naproxen, or other non-steroidal                            anti-inflammatory drugs for 2 weeks.                           - Repeat colonoscopy for surveillance based on                            pathology results. Procedure Code(s):        --- Professional ---                           [id number], Colonoscopy, flexible; with removal of                            tumor(s), polyp(s), or other lesion(s) by snare                            technique Diagnosis Code(s):        --- Professional ---                           Z12.11, Encounter for screening for malignant                            neoplasm of colon                           D12.5, Benign neoplasm of sigmoid colon                           K64.0, First degree hemorrhoids CPT copyright 2016 American Medical Association. All rights reserved. The codes documented in this report are preliminary and upon coder review may  be revised to meet current compliance requirements. Shirley Friar,  MD 12/27/2016 10:10:53 AM This report has been signed electronically. Number of Addenda: 0

## 2016-12-27 NOTE — Anesthesia Preprocedure Evaluation (Addendum)
Anesthesia Evaluation  Patient identified by MRN, date of birth, ID band Patient awake    Reviewed: Allergy & Precautions, NPO status , Patient's Chart, lab work & pertinent test results  Airway Mallampati: II  TM Distance: >3 FB Neck ROM: Full    Dental  (+) Teeth Intact   Pulmonary neg pulmonary ROS,    breath sounds clear to auscultation       Cardiovascular + CAD and + CABG   Rhythm:Regular Rate:Normal     Neuro/Psych negative neurological ROS     GI/Hepatic GERD  ,Transplant   Endo/Other  diabetes  Renal/GU      Musculoskeletal  (+) Arthritis ,   Abdominal   Peds  Hematology   Anesthesia Other Findings   Reproductive/Obstetrics                            Anesthesia Physical Anesthesia Plan  ASA: III  Anesthesia Plan: MAC   Post-op Pain Management:    Induction: Intravenous  Airway Management Planned: Natural Airway  Additional Equipment:   Intra-op Plan:   Post-operative Plan:   Informed Consent: I have reviewed the patients History and Physical, chart, labs and discussed the procedure including the risks, benefits and alternatives for the proposed anesthesia with the patient or authorized representative who has indicated his/her understanding and acceptance.     Plan Discussed with:   Anesthesia Plan Comments:         Anesthesia Quick Evaluation

## 2016-12-27 NOTE — Interval H&P Note (Signed)
History and Physical Interval Note:  12/27/2016 8:32 AM  [name]  has presented today for surgery, with the diagnosis of screening  The various methods of treatment have been discussed with the patient and family. After consideration of risks, benefits and other options for treatment, the patient has consented to  Procedure(s): COLONOSCOPY WITH PROPOFOL (N/A) as a surgical intervention .  The patient's history has been reviewed, patient examined, no change in status, stable for surgery.  I have reviewed the patient's chart and labs.  Questions were answered to the patient's satisfaction.     Amirra Herling C.

## 2017-03-01 NOTE — Addendum Note (Signed)
Addendum  created 03/01/17 1307 by Sharee Holster, MD   Sign clinical note

## 2019-01-08 ENCOUNTER — Other Ambulatory Visit: Payer: Self-pay

## 2019-01-08 MED ORDER — ATORVASTATIN CALCIUM 10 MG PO TABS: 10.0000 mg | ORAL_TABLET | Freq: Every evening | ORAL | 0 refills | Status: DC

## 2019-04-01 ENCOUNTER — Other Ambulatory Visit: Payer: Self-pay | Admitting: Cardiology

## 2019-06-04 ENCOUNTER — Ambulatory Visit: Payer: Self-pay | Admitting: Cardiology

## 2019-06-04 NOTE — Progress Notes (Deleted)
Primary Physician/Referring:  Aida PufferLittle, James, MD  Patient ID: [name] Alan [name], male    DOB: [birth year], 73 y.o.   MRN: [id number]  No chief complaint on file.  HPI:    HPI: [name] [name]  is Alan 73 y.o. ***  Past Medical History:  Diagnosis Date  . Arthritis    psoriatic  . Cirrhosis (HCC)   . Coronary artery disease   . Diabetes mellitus without complication (HCC)    type 2  . GERD (gastroesophageal reflux disease)   . Psoriasis    Past Surgical History:  Procedure Laterality Date  . APPENDECTOMY    . CHOLECYSTECTOMY    . COLONOSCOPY WITH PROPOFOL N/Alan 12/27/2016   Procedure: COLONOSCOPY WITH PROPOFOL;  Surgeon: Charlott RakesVincent Schooler, MD;  Location: [facility];  Service: Endoscopy;  Laterality: N/Alan;  . CORONARY ARTERY BYPASS GRAFT  2005   x 4  . EYE SURGERY Bilateral    ioc for cataracts  . liver tranplant   [facility]  . PATELLA FRACTURE SURGERY Left   . surgery on broken leg Left 1991   multiple surgies with rod replaced and removed and redoved   Social History   Socioeconomic History  . Marital status: Married    Spouse name: Not on file  . Number of children: Not on file  . Years of education: Not on file  . Highest education level: Not on file  Occupational History  . Not on file  Social Needs  . Financial resource strain: Not on file  . Food insecurity    Worry: Not on file    Inability: Not on file  . Transportation needs    Medical: Not on file    Non-medical: Not on file  Tobacco Use  . Smoking status: Never Smoker  . Smokeless tobacco: Never Used  Substance and Sexual Activity  . Alcohol use: No  . Drug use: No  . Sexual activity: Not on file  Lifestyle  . Physical activity    Days per week: Not on file    Minutes per session: Not on file  . Stress: Not on file  Relationships  . Social Musicianconnections    Talks on phone: Not on file    Gets together: Not on file    Attends religious service: Not on file    Active member of club or organization:  Not on file    Attends meetings of clubs or organizations: Not on file    Relationship status: Not on file  . Intimate partner violence    Fear of current or ex partner: Not on file    Emotionally abused: Not on file    Physically abused: Not on file    Forced sexual activity: Not on file  Other Topics Concern  . Not on file  Social History Narrative  . Not on file   ROS  ***ROS Objective  There were no vitals taken for this visit. There is no height or weight on file to calculate BMI.   ***Physical Exam Radiology: No results found.  Laboratory examination:  *** No results for input(s): NA, K, CL, CO2, GLUCOSE, BUN, CREATININE, CALCIUM, GFRNONAA, GFRAA in the last 8760 hours. CMP Latest Ref Rng & Units 02/22/2012 02/22/2012 02/09/2011  Glucose 70 - 99 mg/dL 409(W138(H) 119(J148(H) 96  BUN 6 - 23 mg/dL 17 17 17   Creatinine 0.50 - 1.35 mg/dL 4.780.93 2.950.91 6.210.74 ICTERUS AT THIS LEVEL MAY AFFECT RESULT  Sodium 135 - 145 mEq/L 128(L) 129(L)  136  Potassium 3.5 - 5.1 mEq/L 4.7 4.5 3.8  Chloride 96 - 112 mEq/L 98 98 109  CO2 19 - 32 mEq/L 19 19 21   Calcium 8.4 - 10.5 mg/dL 8.8 8.9 8.9  Total Protein 6.0 - 8.3 g/dL 7.0 7.1 6.9  Total Bilirubin 0.3 - 1.2 mg/dL 5.9(H) 5.4(H) 3.7(H)  Alkaline Phos 39 - 117 U/L 134(H) 144(H) 129(H)  AST 0 - 37 U/L 65(H) 68(H) 65(H)  ALT 0 - 53 U/L 34 34 46   CBC Latest Ref Rng & Units 02/22/2012 02/22/2012 02/09/2011  WBC 4.0 - 10.5 K/uL 6.6 5.9 5.0  Hemoglobin 13.0 - 17.0 g/dL 13.3 13.7 12.0(L)  Hematocrit 39.0 - 52.0 % 37.0(L) 38.1(L) 35.3(L)  Platelets 150 - 400 K/uL 47(L) 50(L) 56 DELTA CHECK NOTED(L)   Lipid Panel  No results found for: CHOL, TRIG, HDL, CHOLHDL, VLDL, LDLCALC, LDLDIRECT HEMOGLOBIN A1C Lab Results  Component Value Date   HGBA1C  12/10/2010    4.8 (NOTE)                                                                       According to the ADA Clinical Practice Recommendations for 2011, when HbA1c is used as Alan screening test:   >=6.5%    Diagnostic of Diabetes Mellitus           (if abnormal result  is confirmed)  5.7-6.4%   Increased risk of developing Diabetes Mellitus  References:Diagnosis and Classification of Diabetes Mellitus,Diabetes FGHW,2993,71(IRCVE 1):S62-S69 and Standards of Medical Care in         Diabetes - 2011,Diabetes Care,2011,34  (Suppl 1):S11-S61.   MPG 91 12/10/2010   TSH No results for input(s): TSH in the last 8760 hours. Medications   Current Outpatient Medications  Medication Instructions  . aspirin EC 81 mg, Oral, Daily  . atorvastatin (LIPITOR) 10 MG tablet TAKE 1 TABLET BY MOUTH EVERY DAY IN THE EVENING  . Calcium Carbonate-Vitamin D (CALCIUM + D PO) 1 tablet, Oral, 2 times daily  . clotrimazole-betamethasone (LOTRISONE) cream 1 application, Topical, Daily PRN  . insulin aspart (NOVOLOG) 2 Units, Subcutaneous, 2 times daily  . insulin glargine (LANTUS) 15 Units, Subcutaneous, Daily at bedtime  . metFORMIN (GLUCOPHAGE) 500 mg, Oral, Daily  . metoprolol succinate (TOPROL-XL) 25 mg, Oral, Daily  . mycophenolate (MYFORTIC) 540 mg, Oral, 2 times daily  . omeprazole (PRILOSEC) 20 mg, Oral, Daily  . tacrolimus (PROGRAF) 1 mg, Oral, 2 times daily    Cardiac Studies:   ***  Assessment  No diagnosis found.  ***  Recommendations:   ***  Miquel Dunn, MD, [facility] 06/04/2019, 12:47 PM [facility] [place] Pager: [phone] Office: [phone] If no answer Cell [phone]

## 2019-06-05 ENCOUNTER — Ambulatory Visit: Payer: Self-pay | Admitting: Cardiology

## 2019-06-25 ENCOUNTER — Other Ambulatory Visit: Payer: Self-pay | Admitting: Cardiology

## 2019-07-27 ENCOUNTER — Other Ambulatory Visit: Payer: Self-pay

## 2019-07-27 ENCOUNTER — Ambulatory Visit (INDEPENDENT_AMBULATORY_CARE_PROVIDER_SITE_OTHER): Payer: Medicare Other | Admitting: Cardiology

## 2019-07-27 ENCOUNTER — Encounter: Payer: Self-pay | Admitting: Cardiology

## 2019-07-27 VITALS — BP 128/82 | HR 61 | Ht 67.0 in | Wt 188.2 lb

## 2019-07-27 DIAGNOSIS — Z951 Presence of aortocoronary bypass graft: Secondary | ICD-10-CM

## 2019-07-27 DIAGNOSIS — Z944 Liver transplant status: Secondary | ICD-10-CM

## 2019-07-27 DIAGNOSIS — I2510 Atherosclerotic heart disease of native coronary artery without angina pectoris: Secondary | ICD-10-CM | POA: Diagnosis not present

## 2019-07-27 DIAGNOSIS — E7800 Pure hypercholesterolemia, unspecified: Secondary | ICD-10-CM | POA: Diagnosis not present

## 2019-07-27 DIAGNOSIS — I10 Essential (primary) hypertension: Secondary | ICD-10-CM | POA: Diagnosis not present

## 2019-07-27 NOTE — Progress Notes (Signed)
Primary Physician/Referring:  Leonard Downing, MD  Patient ID: [name], male    DOB: 03-Jan-1946, 73 y.o.   MRN: [id number]  Chief Complaint  Patient presents with  . Coronary Artery Disease  . Follow-up    2yr  HPI:    [name] is a 73y.o.  Caucasian male with history of known coronary artery disease and CABG in the past, he has [place] [facility] [place] [facility]  He now presents here for follow-up of bradycardia, CAD, hypertension. He remains asymptomatic without chest pain, dyspnea or dizziness.  He has done remarkably well and has not had any complications from liver transplant.  Past Medical History:  Diagnosis Date  . Arthritis    psoriatic  . Cirrhosis ([facility]   . Coronary artery disease   . Diabetes mellitus without complication ([facility]    type 2  . GERD (gastroesophageal reflux disease)   . Psoriasis    Past Surgical History:  Procedure Laterality Date  . APPENDECTOMY    . CHOLECYSTECTOMY    . COLONOSCOPY WITH PROPOFOL N/A 12/27/2016   Procedure: COLONOSCOPY WITH PROPOFOL;  Surgeon: VWilford Corner MD;  Location: [facility];  Service: Endoscopy;  Laterality: N/A;  . CORONARY ARTERY BYPASS GRAFT  2005   x 4  . EYE SURGERY Bilateral    ioc for cataracts  . liver tranplant   [facility]  . PATELLA FRACTURE SURGERY Left   . surgery on broken leg Left 1991   multiple surgies with rod replaced and removed and redoved   Social History   Socioeconomic History  . Marital status: Married    Spouse name: Not on file  . Number of children: 5  . Years of education: Not on file  . Highest education level: Not on file  Occupational History  . Not on file  Social Needs  . Financial resource strain: Not on file  . Food insecurity    Worry: Not on file    Inability: Not on file  . Transportation needs    Medical: Not on file    Non-medical: Not on file   Tobacco Use  . Smoking status: Never Smoker  . Smokeless tobacco: Never Used  Substance and Sexual Activity  . Alcohol use: No  . Drug use: No  . Sexual activity: Not on file  Lifestyle  . Physical activity    Days per week: Not on file    Minutes per session: Not on file  . Stress: Not on file  Relationships  . Social [place]: Not on file    Gets together: Not on file    Attends religious service: Not on file    Active member of club or organization: Not on file    Attends meetings of clubs or organizations: Not on file    Relationship status: Not on file  . Intimate partner violence    Fear of current or ex partner: Not on file    Emotionally abused: Not on file    Physically abused: Not on file    Forced sexual activity: Not on file  Other Topics Concern  . Not on file  Social History Narrative  . Not on file   ROS  Review of Systems  Constitution: Negative for chills, decreased appetite, malaise/fatigue and weight gain.  Cardiovascular: Negative for dyspnea on exertion, leg swelling and syncope.  Endocrine:  Negative for cold intolerance.  Hematologic/Lymphatic: Does not bruise/bleed easily.  Musculoskeletal: Negative for joint swelling.  Gastrointestinal: Negative for abdominal pain, anorexia, change in bowel habit, hematochezia and melena.  Neurological: Negative for headaches and light-headedness.  Psychiatric/Behavioral: Negative for depression and substance abuse.  All other systems reviewed and are negative.  Objective   Vitals with BMI 07/27/2019 12/27/2016 12/27/2016  Height 5' 7"  - -  Weight 188 lbs 3 oz - -  BMI 01.75 - -  Systolic 102 585 277  Diastolic 82 89 74  Pulse 61 67 64    Blood pressure 128/82, pulse 61, height 5' 7"  (1.702 m), weight 188 lb 3.2 oz (85.4 kg), SpO2 97 %. Body mass index is 29.48 kg/m.   Physical Exam  HENT:  Head: Atraumatic.  Eyes: Conjunctivae are normal.  Neck: Neck supple. No JVD present. No  thyromegaly present.  Cardiovascular: Normal rate, regular rhythm, normal heart sounds and intact distal pulses. Exam reveals no gallop.  No murmur heard. No leg edema, no JVD. Except for bilateral PT 1+, vascular exam normal  Pulmonary/Chest: Effort normal and breath sounds normal.  Abdominal: Soft. Bowel sounds are normal.  Musculoskeletal: Normal range of motion.  Neurological: He is alert.  Skin: Skin is warm and dry.  Psychiatric: He has a normal mood and affect.    Laboratory examination:   CBC w/ DifferentialResulted: 07/08/2019 7:07 AM [facility] Component Name Value Ref Range  WBC 7.6 3.4 - 10.8 x10E3/uL  RBC 6.23 (H) 4.14 - 5.8 x10E6/uL  HGB 16.3 13 - 17.7 g/dL  HCT 48.9 37.5 - 51 %  MCV 79 79 - 97 fL  MCH 26.2 (L) 26.6 - 33 pg  MCHC 33.3 31.5 - 35.7 g/dL  RDW 16.5 (H) 11.6 - 15.4 %  Platelet 177 150 - 450 x10E3/uL   Comprehensive Metabolic PanelResulted: 82/12/2351 7:07 AM [facility] Component Name Value Ref Range  Glucose 105 (H) 65 - 99 mg/dL  BUN 16 8 - 27 mg/dL  Creatinine 1.30 (H) 0.76 - 1.27 mg/dL  BUN/Creatinine Ratio 12 10 - 24   Sodium 141 134 - 144 mmol/L  Potassium 5.0 3.5 - 5.2 mmol/L  Chloride 105 96 - 106 mmol/L  CO2 20 20 - 29 mmol/L  Calcium 9.8 8.6 - 10.2 mg/dL  Total Protein 6.8 6 - 8.5 g/dL  Albumin 4.3 3.7 - 4.7 g/dL  Globulin, Total 2.5 1.5 - 4.5 g/dL  A/G Ratio 1.7 1.2 - 2.2   Total Bilirubin 0.6 0 - 1.2 mg/dL  Alkaline Phosphatase 81 39 - 117 IU/L  AST 17 0 - 40 IU/L  ALT 17 0 - 44 IU/L    Labs 05/19/2018: HB 16.5/HCT 47.5, platelets 156. CMP normal, serum glucose 131 mg, BUN 17, creatinine 1.16, eGFR greater than 60 ML. A1c 7.0%. No lipid profile. 06/18/2017: Cholesterol 114, triglycerides 113, HDL 40, LDL 51. TSH 7.01.  No results for input(s): NA, K, CL, CO2, GLUCOSE, BUN, CREATININE, CALCIUM, GFRNONAA, GFRAA in the last 8760 hours. CMP Latest Ref Rng & Units 02/22/2012 02/22/2012 02/09/2011  Glucose 70 - 99 mg/dL 138(H)  148(H) 96  BUN 6 - 23 mg/dL 17 17 17   Creatinine 0.50 - 1.35 mg/dL 0.93 0.91 0.74 ICTERUS AT THIS LEVEL MAY AFFECT RESULT  Sodium 135 - 145 mEq/L 128(L) 129(L) 136  Potassium 3.5 - 5.1 mEq/L 4.7 4.5 3.8  Chloride 96 - 112 mEq/L 98 98 109  CO2 19 - 32 mEq/L 19 19 21   Calcium  8.4 - 10.5 mg/dL 8.8 8.9 8.9  Total Protein 6.0 - 8.3 g/dL 7.0 7.1 6.9  Total Bilirubin 0.3 - 1.2 mg/dL 5.9(H) 5.4(H) 3.7(H)  Alkaline Phos 39 - 117 U/L 134(H) 144(H) 129(H)  AST 0 - 37 U/L 65(H) 68(H) 65(H)  ALT 0 - 53 U/L 34 34 46   CBC Latest Ref Rng & Units 02/22/2012 02/22/2012 02/09/2011  WBC 4.0 - 10.5 K/uL 6.6 5.9 5.0  Hemoglobin 13.0 - 17.0 g/dL 13.3 13.7 12.0(L)  Hematocrit 39.0 - 52.0 % 37.0(L) 38.1(L) 35.3(L)  Platelets 150 - 400 K/uL 47(L) 50(L) 56 DELTA CHECK NOTED(L)   Lipid Panel  No results found for: CHOL, TRIG, HDL, CHOLHDL, VLDL, LDLCALC, LDLDIRECT HEMOGLOBIN A1C Lab Results  Component Value Date   HGBA1C  12/10/2010    4.8 (NOTE)                                                                       According to the ADA Clinical Practice Recommendations for 2011, when HbA1c is used as a screening test:   >=6.5%   Diagnostic of Diabetes Mellitus           (if abnormal result  is confirmed)  5.7-6.4%   Increased risk of developing Diabetes Mellitus  References:Diagnosis and Classification of Diabetes Mellitus,Diabetes YOVZ,8588,50(YDXAJ 1):S62-S69 and Standards of Medical Care in         Diabetes - 2011,Diabetes Care,2011,34  (Suppl 1):S11-S61.   MPG 91 12/10/2010   TSH No results for input(s): TSH in the last 8760 hours. Medications and allergies   Allergies  Allergen Reactions  . Methotrexate Other (See Comments)    Liver failure     Prior to Admission medications   Medication Sig Start Date End Date Taking? Authorizing Provider  aspirin EC 81 MG tablet Take 81 mg by mouth daily.    [provider]  atorvastatin (LIPITOR) 10 MG tablet TAKE 1 TABLET BY MOUTH EVERY DAY IN THE  EVENING 06/25/19   Adrian Prows, MD  Calcium Carbonate-Vitamin D (CALCIUM + D PO) Take 1 tablet by mouth 2 (two) times daily.     [provider]  clotrimazole-betamethasone (LOTRISONE) cream Apply 1 application topically daily as needed (psoriasis).    [provider]  insulin aspart (NOVOLOG) 100 UNIT/ML injection Inject 2 Units into the skin 2 (two) times daily.    [provider]  insulin glargine (LANTUS) 100 UNIT/ML injection Inject 15 Units into the skin at bedtime.    [provider]  metFORMIN (GLUCOPHAGE) 500 MG tablet Take 500 mg by mouth daily.    [provider]  metoprolol succinate (TOPROL-XL) 25 MG 24 hr tablet Take 25 mg by mouth daily.    [provider]  mycophenolate (MYFORTIC) 180 MG EC tablet Take 540 mg by mouth 2 (two) times daily.    [provider]  omeprazole (PRILOSEC) 20 MG capsule Take 20 mg by mouth daily.    [provider]  tacrolimus (PROGRAF) 1 MG capsule Take 1 mg by mouth 2 (two) times daily.    [provider]     Current Outpatient Medications  Medication Instructions  . aspirin EC 81 mg, Oral, Daily  . atorvastatin (LIPITOR) 10 MG tablet  TAKE 1 TABLET BY MOUTH EVERY DAY IN THE EVENING  . betamethasone dipropionate 0.05 % cream Topical, As needed  . Calcium Carbonate-Vitamin D (CALCIUM + D PO) 1 tablet, Oral, 2 times daily  . insulin glargine (LANTUS) 34 Units, Subcutaneous, Daily at bedtime  . metFORMIN (GLUCOPHAGE) 500 mg, Oral, Daily  . metoprolol succinate (TOPROL-XL) 100 MG 24 hr tablet Oral, Daily  . mycophenolate (MYFORTIC) 540 mg, Oral, 2 times daily  . omeprazole (PRILOSEC) 20 mg, Oral, Daily  . tacrolimus (PROGRAF) 1 mg, Oral, 2 times daily    Radiology:  No results found.   Cardiac Studies:   S/P CABG 1995: Cardiac cath: 08/15/10:  LIMA to LAD, SVG to RI, SVG to PDA ectatic with sluggish flow (recommend medical therapy ) and Free left radial graft to OM2.  EF  normal.  Lexiscan stress 07/28/10: Mid anterior and inferolateral ischemia. Normal LV.   Assessment     ICD-10-CM   1. Coronary artery disease involving native coronary artery of native heart without angina pectoris  I25.10 EKG 12-Lead  2. Hx of CABG  Z95.1    CABG 1995: Cardiac cath: 08/15/10: Patent LIMA to LAD, SVG to RI, SVG to PDA ectatic, Free left radial graft to OM2  3. Hypercholesteremia  E78.00 Lipid Panel With LDL/HDL Ratio    TSH  4. Essential hypertension  I10 TSH  5. 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  Normal QT interval.  No evidence of ischemia. Compared to EKG 01/29/2018, Left axis deviation and first-degree AV block.  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  He is presently doing well and comes here for annual visit and follow-up, he has not had any axis, no dyspnea, no dizziness or syncope.  Physical examination remains unchanged.  I reviewed his labs from [place], he will need lipid profile testing.  I'll get TSH as well in view of new onset first-degree AV block.  States that his diabetes is now well-controlled.  I'm following up on his lipids as LDL was slightly elevated on his prior office visit.  Unless this is markedly abnormal, I'll see him back in a year.  I'll make changes to his medications once I review his labs.  Adrian Prows, MD, [facility] 07/27/2019, 11:33 AM [facility] [place] Pager: [phone] Office: [phone] If no answer Cell [phone]

## 2019-09-01 LAB — LIPID PANEL WITH LDL/HDL RATIO
Cholesterol, Total: 130 mg/dL (ref 100–199)
HDL: 43 mg/dL (ref >39)
LDL Chol Calc (NIH): 67 mg/dL (ref 0–99)
LDL/HDL Ratio: 1.6 ratio (ref 0.0–3.6)
Triglycerides: 110 mg/dL (ref 0–149)
VLDL Cholesterol Cal: 20 mg/dL (ref 5–40)

## 2019-09-01 LAB — TSH: TSH: 6.43 u[IU]/mL — ABNORMAL HIGH (ref 0.450–4.500)

## 2019-09-02 NOTE — Progress Notes (Signed)
Called pt wife answered and informed her about pt lab results.

## 2020-07-25 ENCOUNTER — Encounter: Payer: Self-pay | Admitting: Cardiology

## 2020-07-25 ENCOUNTER — Other Ambulatory Visit: Payer: Self-pay

## 2020-07-25 ENCOUNTER — Ambulatory Visit: Payer: BC Managed Care – PPO | Admitting: Cardiology

## 2020-07-25 VITALS — BP 132/86 | HR 62 | Ht 67.0 in | Wt 187.0 lb

## 2020-07-25 DIAGNOSIS — E781 Pure hyperglyceridemia: Secondary | ICD-10-CM

## 2020-07-25 DIAGNOSIS — R790 Abnormal level of blood mineral: Secondary | ICD-10-CM | POA: Insufficient documentation

## 2020-07-25 DIAGNOSIS — I2510 Atherosclerotic heart disease of native coronary artery without angina pectoris: Secondary | ICD-10-CM

## 2020-07-25 DIAGNOSIS — Z944 Liver transplant status: Secondary | ICD-10-CM

## 2020-07-25 DIAGNOSIS — N182 Chronic kidney disease, stage 2 (mild): Secondary | ICD-10-CM

## 2020-07-25 DIAGNOSIS — I440 Atrioventricular block, first degree: Secondary | ICD-10-CM

## 2020-07-25 DIAGNOSIS — I10 Essential (primary) hypertension: Secondary | ICD-10-CM

## 2020-07-25 MED ORDER — ICOSAPENT ETHYL 1 G PO CAPS: 2.0000 g | ORAL_CAPSULE | Freq: Two times a day (BID) | ORAL | 3 refills | Status: DC

## 2020-07-25 NOTE — Patient Instructions (Signed)
Blood work in 1 month at [facility].

## 2020-07-25 NOTE — Progress Notes (Signed)
Primary Physician/Referring:  Leonard Downing, MD  Patient ID: Joaquim Lai, male    DOB: January 17, 1946, 74 y.o.   MRN: [id number]  Chief Complaint  Patient presents with  . Coronary Artery Disease  . Follow-up   HPI:    JASSIAH VIVIANO  is a 74 y.o.  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  Patient has done remarkably well, without complications from liver transplant.  Patient presents for annual visit.  He remains asymptomatic without chest pain, dyspnea, dizziness, shortness of breath, orthopnea, PND, swelling.  He reports he continues to be active around his property including caring for his horse and donkey without issue.  He reports home blood pressure readings 120-130/70-80.  According to patient diabetes is well controlled, and he had hemoglobin A1c drawn this morning.  Past Medical History:  Diagnosis Date  . Arthritis    psoriatic  . Cirrhosis ([place])   . Coronary artery disease   . Diabetes mellitus without complication ([place])    type 2  . GERD (gastroesophageal reflux disease)   . Low magnesium levels   . Psoriasis    Past Surgical History:  Procedure Laterality Date  . APPENDECTOMY    . CHOLECYSTECTOMY    . COLONOSCOPY WITH PROPOFOL N/A 12/27/2016   Procedure: COLONOSCOPY WITH PROPOFOL;  Surgeon: Wilford Corner, MD;  Location: [facility];  Service: Endoscopy;  Laterality: N/A;  . CORONARY ARTERY BYPASS GRAFT  2005   x 4  . EYE SURGERY Bilateral    ioc for cataracts  . liver [facility]   [facility]  . PATELLA FRACTURE SURGERY Left   . surgery on broken leg Left 1991   multiple surgies with rod replaced and removed and redoved   Social History   Tobacco Use  . Smoking status: Never Smoker  . Smokeless tobacco:  Never Used  Substance Use Topics  . Alcohol use: No   Marital Status: Married  ROS  Review of Systems  Constitutional: Negative for malaise/fatigue and weight gain.  Cardiovascular: Negative for chest pain, dyspnea on exertion, leg swelling, orthopnea, palpitations, paroxysmal nocturnal dyspnea and syncope.  Hematologic/Lymphatic: Does not bruise/bleed easily.  Musculoskeletal: Positive for joint pain (left hip pain ). Negative for joint swelling.  Gastrointestinal: Negative for melena.  Neurological: Negative for light-headedness.  All other systems reviewed and are negative.  Objective   Vitals with BMI 07/25/2020 07/27/2019 12/27/2016  Height 5' 7" 5' 7" -  Weight 187 lbs 188 lbs 3 oz -  BMI 09.32 67.12 -  Systolic 458 099 833  Diastolic 86 82 89  Pulse 62 61 67    Blood pressure 132/86, pulse 62, height 5' 7" (1.702 m), weight 187 lb (84.8 kg), SpO2 98 %. Body mass index is 29.29 kg/m.   Physical Exam Vitals reviewed.  HENT:     Head: Normocephalic and atraumatic.  Neck:     Thyroid: No thyromegaly.     Vascular: No JVD.  Cardiovascular:     Rate and Rhythm: Normal rate and regular rhythm.     Pulses: Intact distal pulses.          Carotid pulses are 2+ on the right side and 2+ on the left side.      Radial pulses are 2+ on the right side and 2+  on the left side.       Femoral pulses are 2+ on the right side and 2+ on the left side.      Popliteal pulses are 2+ on the right side and 2+ on the left side.       Dorsalis pedis pulses are 2+ on the right side and 2+ on the left side.       Posterior tibial pulses are 1+ on the right side and 2+ on the left side.     Heart sounds: Normal heart sounds, S1 normal and S2 normal. No murmur heard.  No gallop.      Comments: No leg edema, no JVD.  Pulmonary:     Effort: Pulmonary effort is normal. No respiratory distress.     Breath sounds: Normal breath sounds. No wheezing, rhonchi or rales.  Abdominal:     General: Bowel  sounds are normal.     Palpations: Abdomen is soft.  Musculoskeletal:     Cervical back: Neck supple.     Right lower leg: No edema.     Left lower leg: No edema.  Skin:    General: Skin is warm and dry.  Neurological:     Mental Status: He is alert.    Laboratory examination:   CMP Latest Ref Rng & Units 02/22/2012 02/22/2012 02/09/2011  Glucose 70 - 99 mg/dL 138(H) 148(H) 96  BUN 6 - 23 mg/dL _0 Creatinine 0.50 - 1.35 mg/dL 0.93 0.91 0.74 ICTERUS AT THIS LEVEL MAY AFFECT RESULT  Sodium 135 - 145 mEq/L 128(L) 129(L) 136  Potassium 3.5 - 5.1 mEq/L 4.7 4.5 3.8  Chloride 96 - 112 mEq/L 98 98 109  CO2 19 - 32 mEq/L _1 Calcium 8.4 - 10.5 mg/dL 8.8 8.9 8.9  Total Protein 6.0 - 8.3 g/dL 7.0 7.1 6.9  Total Bilirubin 0.3 - 1.2 mg/dL 5.9(H) 5.4(H) 3.7(H)  Alkaline Phos 39 - 117 U/L 134(H) 144(H) 129(H)  AST 0 - 37 U/L 65(H) 68(H) 65(H)  ALT 0 - 53 U/L 34 34 46   CBC Latest Ref Rng & Units 02/22/2012 02/22/2012 02/09/2011  WBC 4.0 - 10.5 K/uL 6.6 5.9 5.0  Hemoglobin 13.0 - 17.0 g/dL 13.3 13.7 12.0(L)  Hematocrit 39 - 52 % 37.0(L) 38.1(L) 35.3(L)  Platelets 150 - 400 K/uL 47(L) 50(L) 56 DELTA CHECK NOTED(L)   Lipid Panel     Component Value Date/Time   CHOL 130 08/31/2019 0828   TRIG 110 08/31/2019 0828   HDL 43 08/31/2019 0828   LDLCALC 67 08/31/2019 0828   HEMOGLOBIN A1C Lab Results  Component Value Date   HGBA1C  12/10/2010    4.8 (NOTE)                                                                       According to the ADA Clinical Practice Recommendations for 2011, when HbA1c is used as a screening test:   >=6.5%   Diagnostic of Diabetes Mellitus           (if abnormal result  is confirmed)  5.7-6.4%   Increased risk of developing Diabetes Mellitus  References:Diagnosis and Classification of Diabetes Mellitus,Diabetes OQHU,7654,65(KPTWS 1):S62-S69 and Standards of Medical Care in  Diabetes - 2011,Diabetes Care,2011,34  (Suppl 1):S11-S61.   MPG 91  12/10/2010   TSH Recent Labs    08/31/19 0828  TSH 6.430*   External Labs:   06/17/2020: Hemoglobin 16.9, hematocrit 50.4, MCV 77, CBC otherwise normal BUN 16, creatinine 1.4, sodium 134, potassium 5.3, CMP otherwise normal Magnesium 1.8  03/17/2020: Total cholesterol 128, triglycerides 246, HDL 32, LDL 57   05/19/2018: HB 16.5/HCT 47.5, platelets 156. CMP normal, serum glucose 131 mg, BUN 17, creatinine 1.16, eGFR greater than 60 ML. A1c 7.0%. No lipid profile. 06/18/2017: Cholesterol 114, triglycerides 113, HDL 40, LDL 51. TSH 7.01.  Medications and allergies   Allergies  Allergen Reactions  . Methotrexate Other (See Comments)    Liver failure     Prior to Admission medications   Medication Sig Start Date End Date Taking? Authorizing Provider  aspirin EC 81 MG tablet Take 81 mg by mouth daily.    [provider]  atorvastatin (LIPITOR) 10 MG tablet TAKE 1 TABLET BY MOUTH EVERY DAY IN THE EVENING 06/25/19   Adrian Prows, MD  Calcium Carbonate-Vitamin D (CALCIUM + D PO) Take 1 tablet by mouth 2 (two) times daily.     [provider]  clotrimazole-betamethasone (LOTRISONE) cream Apply 1 application topically daily as needed (psoriasis).    [provider]  insulin aspart (NOVOLOG) 100 UNIT/ML injection Inject 2 Units into the skin 2 (two) times daily.    [provider]  insulin glargine (LANTUS) 100 UNIT/ML injection Inject 15 Units into the skin at bedtime.    [provider]  metFORMIN (GLUCOPHAGE) 500 MG tablet Take 500 mg by mouth daily.    [provider]  metoprolol succinate (TOPROL-XL) 25 MG 24 hr tablet Take 25 mg by mouth daily.    [provider]  mycophenolate (MYFORTIC) 180 MG EC tablet Take 540 mg by mouth 2 (two) times daily.    [provider]  omeprazole (PRILOSEC) 20 MG capsule Take 20 mg by mouth daily.    [provider]  tacrolimus (PROGRAF) 1 MG capsule Take 1 mg by mouth 2 (two)  times daily.    [provider]     Current Outpatient Medications  Medication Instructions  . aspirin EC 81 mg, Oral, Daily  . atorvastatin (LIPITOR) 10 MG tablet TAKE 1 TABLET BY MOUTH EVERY DAY IN THE EVENING  . betamethasone dipropionate 0.05 % cream Topical, As needed  . Calcium Carbonate-Vitamin D (CALCIUM + D PO) 1 tablet, Oral, 2 times daily  . icosapent Ethyl (VASCEPA) 2 g, Oral, 2 times daily  . Insulin Glargine (BASAGLAR KWIKPEN [place]) 1 mL, Subcutaneous  . metFORMIN (GLUCOPHAGE) 500 mg, Oral, Daily  . metoprolol succinate (TOPROL-XL) 100 MG 24 hr tablet Oral, Daily  . mycophenolate (MYFORTIC) 540 mg, Oral, 2 times daily  . omeprazole (PRILOSEC) 20 mg, Oral, Daily  . tacrolimus (PROGRAF) 1 mg, Oral, 2 times daily    Radiology:  No results found.   Cardiac Studies:   S/P CABG 1995: Cardiac cath: 08/15/10:  LIMA to LAD, SVG to RI, SVG to PDA ectatic with sluggish flow (recommend medical therapy ) and Free left radial graft to OM2.  EF normal.  Lexiscan stress 07/28/10: Mid anterior and inferolateral ischemia. Normal LV.  EKG:   EKG 07/25/2020: Sinus bradycardia at a rate of 57 bpm with first-degree AV block.  Left atrial abnormality. Left axis.  Poor R wave progression, cannot exclude anterior infarct old. Compared to EKG 07/26/2020, no significant  change.   Assessment     ICD-10-CM   1. Coronary artery disease involving native coronary artery of native heart without angina pectoris  I25.10 EKG 12-Lead    icosapent Ethyl (VASCEPA) 1 g capsule    Lipid Panel With LDL/HDL Ratio    Lipid Panel With LDL/HDL Ratio  2. Hypertriglyceridemia  E78.1 icosapent Ethyl (VASCEPA) 1 g capsule    Lipid Panel With LDL/HDL Ratio    Lipid Panel With LDL/HDL Ratio  3. Essential hypertension  I10   4. 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 1st degree AV block  I44.0 TSH  6. Low magnesium levels  R79.0   7. H/O liver transplant [facility]) 2013  Z94.4       Meds ordered this encounter  Medications  . icosapent Ethyl (VASCEPA) 1 g capsule    Sig: Take 2 capsules (2 g total) by mouth 2 (two) times daily.    Dispense:  60 capsule    Refill:  3   Medications Discontinued During This Encounter  Medication Reason  . insulin glargine (LANTUS) 100 UNIT/ML injection Change in therapy    Recommendations:   RUDI BUNYARD  is a 74 y.o. 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  Patient has done remarkably well, without complications from liver transplant.  Patient presents for annual follow-up of CAD, hypertension, hyperlipidemia.  He is presently doing well without symptoms or clinical signs of heart failure.  Physical exam and EKG remain unchanged from previous.  I personally reviewed external labs from [facility].  Patient's triglycerides are elevated at 246, will start Vascepa.  If patient is unable to get Vascepa due to insurance issues, could consider Lovaza.  We will recheck lipid profile testing in 1 month.  Of note TSH 08/31/2019 was mildly elevated, will recheck.  In regard to coronary artery disease we will continue guideline directed medical therapy including aspirin, atorvastatin, metoprolol succinate.  He is currently not taking an ACE or an ARB due to chronic kidney disease.  Patient's magnesium 06/17/2020 was normal at 1.8.  Blood pressure was initially mildly elevated in the office today, however upon recheck it is well controlled.  Also patient's home blood pressure recordings are well controlled, will continue metoprolol succinate 100 mg daily.  There is no recent hemoglobin A1c for me to review, however according to patient diabetes has been well controlled.  He states he had A1c drawn today, will  follow for results.  Follow-up in 1 year for coronary disease status post CABG hypercholesterolemia.  Patient was seen in collaboration with Dr. Einar Gip. He also reviewed patient's chart and Dr. Einar Gip is in agreement of the plan.    Alethia Berthold, PA-C 07/25/2020, 10:15 AM Office: [phone]

## 2020-07-27 ENCOUNTER — Telehealth: Payer: Self-pay

## 2020-07-27 NOTE — Telephone Encounter (Signed)
Prior auth for vascepa done on cover my meds.

## 2020-07-29 ENCOUNTER — Other Ambulatory Visit: Payer: Self-pay

## 2020-07-29 DIAGNOSIS — I2510 Atherosclerotic heart disease of native coronary artery without angina pectoris: Secondary | ICD-10-CM

## 2020-07-29 DIAGNOSIS — E781 Pure hyperglyceridemia: Secondary | ICD-10-CM

## 2020-07-29 MED ORDER — ICOSAPENT ETHYL 1 G PO CAPS: 2.0000 g | ORAL_CAPSULE | Freq: Two times a day (BID) | ORAL | 3 refills | Status: DC

## 2020-08-01 ENCOUNTER — Telehealth: Payer: Self-pay

## 2020-08-01 NOTE — Telephone Encounter (Signed)
vascepa has been approved 07/27/20 - 07/28/23

## 2020-08-11 ENCOUNTER — Other Ambulatory Visit: Payer: Self-pay

## 2020-08-12 ENCOUNTER — Telehealth: Payer: Self-pay

## 2020-08-12 DIAGNOSIS — I2510 Atherosclerotic heart disease of native coronary artery without angina pectoris: Secondary | ICD-10-CM

## 2020-08-12 DIAGNOSIS — E781 Pure hyperglyceridemia: Secondary | ICD-10-CM

## 2020-08-12 NOTE — Telephone Encounter (Signed)
Patient was approved for Vascepa, but even after the approval, the medication still cost $100 out of pocket. Patient stated that he cannot afford it and would like something else called in. Please advise.

## 2020-08-12 NOTE — Telephone Encounter (Signed)
It is for 3 month supply and best out there. If it is only for one month, let me know

## 2020-08-12 NOTE — Telephone Encounter (Signed)
Went over meds

## 2020-08-15 MED ORDER — OMEGA-3-ACID ETHYL ESTERS 1 G PO CAPS: 2.0000 g | ORAL_CAPSULE | Freq: Two times a day (BID) | ORAL | 3 refills | Status: DC

## 2020-08-15 NOTE — Telephone Encounter (Signed)
I sent another generic Rx. He can discuss with transplant team. But should be safe

## 2020-08-15 NOTE — Telephone Encounter (Signed)
He said that it is for a month supply and needs to be approved through [facility], because he is liver transplant. Anything that you change it to, has to be approved through them first.

## 2020-08-15 NOTE — Telephone Encounter (Signed)
ICD-10-CM   1. Coronary artery disease involving native coronary artery of native heart without angina pectoris  I25.10 omega-3 acid ethyl esters (LOVAZA) 1 g capsule  2. Hypertriglyceridemia  E78.1 omega-3 acid ethyl esters (LOVAZA) 1 g capsule    Meds ordered this encounter  Medications  . omega-3 acid ethyl esters (LOVAZA) 1 g capsule    Sig: Take 2 capsules (2 g total) by mouth 2 (two) times daily.    Dispense:  360 capsule    Refill:  3    Medications Discontinued During This Encounter  Medication Reason  . icosapent Ethyl (VASCEPA) 1 g capsule Cost of medication     Yates Decamp, MD, [facility] 08/15/2020, 6:04 PM Office: [phone] Pager: [phone]

## 2020-08-17 NOTE — Telephone Encounter (Signed)
Patient/wife  said he that they went ahead and paid for the Vascepa yesterday but has not taken it until they get approval from [facility]. If they do not approve it, they will pick Omega 3.

## 2021-07-25 ENCOUNTER — Encounter: Payer: Self-pay | Admitting: Student

## 2021-07-25 ENCOUNTER — Ambulatory Visit: Payer: Medicare Other | Admitting: Student

## 2021-07-25 ENCOUNTER — Other Ambulatory Visit: Payer: Self-pay

## 2021-07-25 VITALS — BP 114/77 | HR 65 | Ht 67.0 in | Wt 199.0 lb

## 2021-07-25 DIAGNOSIS — E781 Pure hyperglyceridemia: Secondary | ICD-10-CM

## 2021-07-25 DIAGNOSIS — I10 Essential (primary) hypertension: Secondary | ICD-10-CM

## 2021-07-25 DIAGNOSIS — Z944 Liver transplant status: Secondary | ICD-10-CM

## 2021-07-25 DIAGNOSIS — I2510 Atherosclerotic heart disease of native coronary artery without angina pectoris: Secondary | ICD-10-CM

## 2021-07-25 NOTE — Progress Notes (Signed)
Primary Physician/Referring:  Leonard Downing, MD  Patient ID: [name], male    DOB: 04-16-1946, 75 y.o.   MRN: [id number]  Chief Complaint  Patient presents with   Coronary Artery Disease   Follow-up   HPI:    [name]  is a 75 y.o.  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  Patient has done remarkably well, without complications from liver transplant.  Patient presents for annual follow-up.  Last office visit started Vascepa, however unfortunately repeat lipid profile testing has not been done.  Patient is doing well overall without specific complaints today.  Diabetes reportedly remains well controlled.  He denies chest pain, dyspnea, syncope, near syncope.  Denies orthopnea, PND, leg edema.  He remains active around his home and property without issue.  Past Medical History:  Diagnosis Date   Arthritis    psoriatic   Cirrhosis ([place])    Coronary artery disease    Diabetes mellitus without complication (HCC)    type 2   GERD (gastroesophageal reflux disease)    Low magnesium levels    Psoriasis   History reviewed. No pertinent family history. Past Surgical History:  Procedure Laterality Date   APPENDECTOMY     CHOLECYSTECTOMY     COLONOSCOPY WITH PROPOFOL N/A 12/27/2016   Procedure: COLONOSCOPY WITH PROPOFOL;  Surgeon: Wilford Corner, MD;  Location: [facility];  Service: Endoscopy;  Laterality: N/A;   CORONARY ARTERY BYPASS GRAFT  2005   x 4   EYE SURGERY Bilateral    ioc for cataracts   liver tranplant   2013 at [facility]   [place]ouse Left    surgery on broken leg Left 1991   multiple surgies with rod replaced and removed and redoved   Social History   Tobacco Use   Smoking status: Never    Smokeless tobacco: Never  Substance Use Topics   Alcohol use: No   Marital Status: Married  ROS  Review of Systems  Cardiovascular:  Negative for chest pain, claudication, dyspnea on exertion, leg swelling, near-syncope, orthopnea, palpitations, paroxysmal nocturnal dyspnea and syncope.  Respiratory:  Negative for shortness of breath.   Hematologic/Lymphatic: Does not bruise/bleed easily.  Musculoskeletal:  Positive for joint pain (left hip pain ).  All other systems reviewed and are negative. Objective   Vitals with BMI 07/25/2021 07/25/2021 07/25/2020  Height - 5' 7"  5' 7"   Weight - 199 lbs 187 lbs  BMI - 14.10 30.13  Systolic 143 888 757  Diastolic 77 89 86  Pulse 65 62 62    Blood pressure 114/77, pulse 65, height 5' 7"  (1.702 m), weight 199 lb (90.3 kg), SpO2 96 %. Body mass index is 31.17 kg/m.   Physical Exam Vitals reviewed.  Neck:     Thyroid: No thyromegaly.     Vascular: No JVD.  Cardiovascular:     Rate and Rhythm: Normal rate and regular rhythm.     Pulses: Intact distal pulses.          Carotid pulses are 2+ on the right side and 2+ on the left side.      Radial pulses are 2+ on the right side and 2+ on the left side.       Femoral pulses are 2+ on the right side and 2+  on the left side.      Popliteal pulses are 2+ on the right side and 2+ on the left side.       Dorsalis pedis pulses are 2+ on the right side and 2+ on the left side.       Posterior tibial pulses are 1+ on the right side and 2+ on the left side.     Heart sounds: Normal heart sounds, S1 normal and S2 normal. No murmur heard.   No gallop.  Pulmonary:     Effort: Pulmonary effort is normal. No respiratory distress.     Breath sounds: Normal breath sounds. No wheezing, rhonchi or rales.  Musculoskeletal:     Cervical back: Neck supple.     Right lower leg: No edema.     Left lower leg: No edema.  Skin:    General: Skin is warm and dry.  Neurological:     Mental Status: He is alert.    Laboratory examination:   CMP Latest Ref Rng & Units 02/22/2012 02/22/2012 02/09/2011  Glucose 70 - 99 mg/dL 138(H) 148(H) 96  BUN 6 - 23 mg/dL 17 17 17   Creatinine 0.50 - 1.35 mg/dL 0.93 0.91 0.74 ICTERUS AT THIS LEVEL MAY AFFECT RESULT  Sodium 135 - 145 mEq/L 128(L) 129(L) 136  Potassium 3.5 - 5.1 mEq/L 4.7 4.5 3.8  Chloride 96 - 112 mEq/L 98 98 109  CO2 19 - 32 mEq/L 19 19 21   Calcium 8.4 - 10.5 mg/dL 8.8 8.9 8.9  Total Protein 6.0 - 8.3 g/dL 7.0 7.1 6.9  Total Bilirubin 0.3 - 1.2 mg/dL 5.9(H) 5.4(H) 3.7(H)  Alkaline Phos 39 - 117 U/L 134(H) 144(H) 129(H)  AST 0 - 37 U/L 65(H) 68(H) 65(H)  ALT 0 - 53 U/L 34 34 46   CBC Latest Ref Rng & Units 02/22/2012 02/22/2012 02/09/2011  WBC 4.0 - 10.5 K/uL 6.6 5.9 5.0  Hemoglobin 13.0 - 17.0 g/dL 13.3 13.7 12.0(L)  Hematocrit 39.0 - 52.0 % 37.0(L) 38.1(L) 35.3(L)  Platelets 150 - 400 K/uL 47(L) 50(L) 56 DELTA CHECK NOTED(L)   Lipid Panel     Component Value Date/Time   CHOL 130 08/31/2019 0828   TRIG 110 08/31/2019 0828   HDL 43 08/31/2019 0828   LDLCALC 67 08/31/2019 0828   HEMOGLOBIN A1C Lab Results  Component Value Date   HGBA1C  12/10/2010    4.8 (NOTE)                                                                       According to the ADA Clinical Practice Recommendations for 2011, when HbA1c is used as a screening test:   >=6.5%   Diagnostic of Diabetes Mellitus           (if abnormal result  is confirmed)  5.7-6.4%   Increased risk of developing Diabetes Mellitus  References:Diagnosis and Classification of Diabetes Mellitus,Diabetes FQMK,1031,28(FVWAQ 1):S62-S69 and Standards of Medical Care in         Diabetes - 2011,Diabetes Care,2011,34  (Suppl 1):S11-S61.   MPG 91 12/10/2010   TSH No results for input(s): TSH in the last 8760 hours.  External Labs:   06/17/2020: Hemoglobin 16.9, hematocrit 50.4, MCV 77, CBC otherwise normal BUN 16, creatinine  1.4, sodium 134, potassium 5.3, CMP otherwise normal Magnesium  1.8  03/17/2020: Total cholesterol 128, triglycerides 246, HDL 32, LDL 57   05/19/2018: HB 16.5/HCT 47.5, platelets 156. CMP normal, serum glucose 131 mg, BUN 17, creatinine 1.16, eGFR greater than 60 ML. A1c 7.0%. No lipid profile. 06/18/2017: Cholesterol 114, triglycerides 113, HDL 40, LDL 51. TSH 7.01.  Allergies   Allergies  Allergen Reactions   Methotrexate Other (See Comments)    Liver failure    Medications Prior to Visit:   Outpatient Medications Prior to Visit  Medication Sig Dispense Refill   aspirin EC 81 MG tablet Take 81 mg by mouth daily.     atorvastatin (LIPITOR) 10 MG tablet TAKE 1 TABLET BY MOUTH EVERY DAY IN THE EVENING 90 tablet 0   Calcium Carbonate-Vitamin D (CALCIUM + D PO) Take 1 tablet by mouth 2 (two) times daily.      icosapent Ethyl (VASCEPA) 1 g capsule Take 2 g by mouth 2 (two) times daily.     Insulin Glargine (BASAGLAR KWIKPEN [place]) Inject 1 mL into the skin.     LANTUS SOLOSTAR 100 UNIT/ML Solostar Pen SMARTSIG:34 Unit(s) SUB-Q Daily     Magnesium 300 MG CAPS Take 1 tablet by mouth daily.     metFORMIN (GLUCOPHAGE) 500 MG tablet Take 500 mg by mouth daily.     metoprolol succinate (TOPROL-XL) 100 MG 24 hr tablet Take by mouth daily.      mycophenolate (MYFORTIC) 180 MG EC tablet Take 540 mg by mouth 2 (two) times daily.     niacinamide 500 MG tablet niacinamide     omeprazole (PRILOSEC) 20 MG capsule Take 20 mg by mouth daily.     tacrolimus (PROGRAF) 1 MG capsule Take 1 mg by mouth 2 (two) times daily.     betamethasone dipropionate 0.05 % cream Apply topically as needed.     Omeprazole Magnesium (PRILOSEC PO) omeprazole     omega-3 acid ethyl esters (LOVAZA) 1 g capsule Take 2 capsules (2 g total) by mouth 2 (two) times daily. 360 capsule 3   No facility-administered medications prior to visit.   Final Medications at End of Visit    Current Meds  Medication Sig   aspirin EC 81 MG tablet Take 81 mg by mouth daily.   atorvastatin (LIPITOR) 10 MG  tablet TAKE 1 TABLET BY MOUTH EVERY DAY IN THE EVENING   Calcium Carbonate-Vitamin D (CALCIUM + D PO) Take 1 tablet by mouth 2 (two) times daily.    icosapent Ethyl (VASCEPA) 1 g capsule Take 2 g by mouth 2 (two) times daily.   Insulin Glargine (BASAGLAR KWIKPEN ) Inject 1 mL into the skin.   LANTUS SOLOSTAR 100 UNIT/ML Solostar Pen SMARTSIG:34 Unit(s) SUB-Q Daily   Magnesium 300 MG CAPS Take 1 tablet by mouth daily.   metFORMIN (GLUCOPHAGE) 500 MG tablet Take 500 mg by mouth daily.   metoprolol succinate (TOPROL-XL) 100 MG 24 hr tablet Take by mouth daily.    mycophenolate (MYFORTIC) 180 MG EC tablet Take 540 mg by mouth 2 (two) times daily.   niacinamide 500 MG tablet niacinamide   omeprazole (PRILOSEC) 20 MG capsule Take 20 mg by mouth daily.   tacrolimus (PROGRAF) 1 MG capsule Take 1 mg by mouth 2 (two) times daily.   Radiology:  No results found.   Cardiac Studies:   S/P CABG 1995: Cardiac cath: 08/15/10:  LIMA to LAD, SVG to RI, SVG to PDA ectatic with sluggish flow (recommend  medical therapy ) and Free left radial graft to OM2.  EF normal.  Lexiscan stress 07/28/10: Mid anterior and inferolateral ischemia. Normal LV.  EKG:  07/25/2021: Sinus rhythm with first-degree AV block at a rate of 65 bpm.  Left axis, left anterior fascicular block.  Poor R wave progression, cannot exclude anteroseptal infarct old.  No evidence of ischemia or underlying injury pattern.  Compared EKG 07/25/2020, no significant change.  Assessment     ICD-10-CM   1. Coronary artery disease involving native coronary artery of native heart without angina pectoris  I25.10 EKG 12-Lead    Lipid Panel With LDL/HDL Ratio    2. Hypertriglyceridemia  E78.1 Lipid Panel With LDL/HDL Ratio    3. Essential hypertension  I10     4. H/O liver transplant ([place]) 2013  Z94.4       No orders of the defined types were placed in this encounter.  Medications Discontinued During This Encounter  Medication Reason    omega-3 acid ethyl esters (LOVAZA) 1 g capsule Error    Recommendations:   JAYLEE LANTRY  is a 75 y.o. 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  Patient has done remarkably well, without complications from liver transplant.  Patient presents for annual follow-up.  Last office visit started Vascepa, however unfortunately repeat lipid profile testing has not been done.  We will obtain lipid profile testing at this time.  He is tolerating Vascepa without issue, will therefore continue this.  Patient's blood pressure was initially elevated in the office today, however upon recheck it is well controlled.  Will not make changes to antihypertensive medications at this time.  Patient remains asymptomatic.  His EKG and physical exam are unchanged compared to previous.  Patient is otherwise stable from a cardiovascular standpoint.  Follow-up in 1 year, sooner if needed, for CAD, hypertension, hyperlipidemia.   Alethia Berthold, PA-C 07/25/2021, 8:59 AM Office: [phone]

## 2021-07-27 LAB — LIPID PANEL WITH LDL/HDL RATIO
Cholesterol, Total: 106 mg/dL (ref 100–199)
HDL: 39 mg/dL — ABNORMAL LOW (ref >39)
LDL Chol Calc (NIH): 47 mg/dL (ref 0–99)
LDL/HDL Ratio: 1.2 ratio (ref 0.0–3.6)
Triglycerides: 106 mg/dL (ref 0–149)
VLDL Cholesterol Cal: 20 mg/dL (ref 5–40)

## 2021-07-29 NOTE — Progress Notes (Signed)
Lipids well controlled

## 2021-08-01 NOTE — Progress Notes (Signed)
Called pt no answer, left a vm to return the call.

## 2021-08-01 NOTE — Progress Notes (Signed)
Pt called back--he is aware 

## 2021-09-06 ENCOUNTER — Other Ambulatory Visit: Payer: Self-pay | Admitting: Cardiology

## 2021-09-06 DIAGNOSIS — I2510 Atherosclerotic heart disease of native coronary artery without angina pectoris: Secondary | ICD-10-CM

## 2021-09-06 DIAGNOSIS — E781 Pure hyperglyceridemia: Secondary | ICD-10-CM

## 2022-04-06 ENCOUNTER — Ambulatory Visit
Admission: RE | Admit: 2022-04-06 | Discharge: 2022-04-06 | Disposition: A | Discharge status: Home / Self Care | Payer: Medicare Other | Source: Ambulatory Visit | Attending: Nurse Practitioner | Admitting: Nurse Practitioner

## 2022-04-06 ENCOUNTER — Other Ambulatory Visit: Payer: Self-pay | Admitting: Nurse Practitioner

## 2022-04-06 DIAGNOSIS — R059 Cough, unspecified: Secondary | ICD-10-CM

## 2022-05-06 ENCOUNTER — Encounter (HOSPITAL_COMMUNITY): Payer: Self-pay

## 2022-05-06 ENCOUNTER — Encounter (HOSPITAL_BASED_OUTPATIENT_CLINIC_OR_DEPARTMENT_OTHER): Payer: Self-pay

## 2022-05-06 ENCOUNTER — Emergency Department (HOSPITAL_BASED_OUTPATIENT_CLINIC_OR_DEPARTMENT_OTHER): Payer: Medicare Other

## 2022-05-06 ENCOUNTER — Emergency Department (HOSPITAL_BASED_OUTPATIENT_CLINIC_OR_DEPARTMENT_OTHER)
Admission: EM | Admit: 2022-05-06 | Discharge: 2022-05-06 | Disposition: A | Discharge status: Home / Self Care | Payer: Medicare Other | Attending: Emergency Medicine | Admitting: Emergency Medicine

## 2022-05-06 DIAGNOSIS — R55 Syncope and collapse: Secondary | ICD-10-CM

## 2022-05-06 DIAGNOSIS — Z7984 Long term (current) use of oral hypoglycemic drugs: Secondary | ICD-10-CM | POA: Insufficient documentation

## 2022-05-06 DIAGNOSIS — Z951 Presence of aortocoronary bypass graft: Secondary | ICD-10-CM | POA: Insufficient documentation

## 2022-05-06 DIAGNOSIS — Z79899 Other long term (current) drug therapy: Secondary | ICD-10-CM | POA: Insufficient documentation

## 2022-05-06 DIAGNOSIS — I2510 Atherosclerotic heart disease of native coronary artery without angina pectoris: Secondary | ICD-10-CM | POA: Diagnosis not present

## 2022-05-06 DIAGNOSIS — E119 Type 2 diabetes mellitus without complications: Secondary | ICD-10-CM | POA: Diagnosis not present

## 2022-05-06 DIAGNOSIS — Z7982 Long term (current) use of aspirin: Secondary | ICD-10-CM | POA: Insufficient documentation

## 2022-05-06 HISTORY — DX: Syncope and collapse: R55

## 2022-05-06 LAB — CBC WITH DIFFERENTIAL/PLATELET
Abs Immature Granulocytes: 0.04 10*3/uL (ref 0.00–0.07)
Basophils Absolute: 0.1 10*3/uL (ref 0.0–0.1)
Basophils Relative: 1 %
Eosinophils Absolute: 0.2 10*3/uL (ref 0.0–0.5)
Eosinophils Relative: 2 %
HCT: 52.3 % — ABNORMAL HIGH (ref 39.0–52.0)
Hemoglobin: 16.9 g/dL (ref 13.0–17.0)
Immature Granulocytes: 0 %
Lymphocytes Relative: 13 %
Lymphs Abs: 1.3 10*3/uL (ref 0.7–4.0)
MCH: 24.5 pg — ABNORMAL LOW (ref 26.0–34.0)
MCHC: 32.3 g/dL (ref 30.0–36.0)
MCV: 75.9 fL — ABNORMAL LOW (ref 80.0–100.0)
Monocytes Absolute: 0.5 10*3/uL (ref 0.1–1.0)
Monocytes Relative: 5 %
Neutro Abs: 8.3 10*3/uL — ABNORMAL HIGH (ref 1.7–7.7)
Neutrophils Relative %: 79 %
Platelets: 197 10*3/uL (ref 150–400)
RBC: 6.89 MIL/uL — ABNORMAL HIGH (ref 4.22–5.81)
RDW: 18.1 % — ABNORMAL HIGH (ref 11.5–15.5)
WBC: 10.5 10*3/uL (ref 4.0–10.5)
nRBC: 0 % (ref 0.0–0.2)

## 2022-05-06 LAB — URINALYSIS, ROUTINE W REFLEX MICROSCOPIC
Bilirubin Urine: NEGATIVE
Glucose, UA: NEGATIVE mg/dL
Hgb urine dipstick: NEGATIVE
Ketones, ur: NEGATIVE mg/dL
Leukocytes,Ua: NEGATIVE
Nitrite: NEGATIVE
Protein, ur: NEGATIVE mg/dL
Specific Gravity, Urine: 1.018 (ref 1.005–1.030)
pH: 5.5 (ref 5.0–8.0)

## 2022-05-06 LAB — COMPREHENSIVE METABOLIC PANEL
ALT: 24 U/L (ref 0–44)
AST: 22 U/L (ref 15–41)
Albumin: 4.9 g/dL (ref 3.5–5.0)
Alkaline Phosphatase: 60 U/L (ref 38–126)
Anion gap: 12 (ref 5–15)
BUN: 17 mg/dL (ref 8–23)
CO2: 25 mmol/L (ref 22–32)
Calcium: 10.7 mg/dL — ABNORMAL HIGH (ref 8.9–10.3)
Chloride: 99 mmol/L (ref 98–111)
Creatinine, Ser: 1.26 mg/dL — ABNORMAL HIGH (ref 0.61–1.24)
GFR, Estimated: 59 mL/min — ABNORMAL LOW (ref >60)
Glucose, Bld: 107 mg/dL — ABNORMAL HIGH (ref 70–99)
Potassium: 4.6 mmol/L (ref 3.5–5.1)
Sodium: 136 mmol/L (ref 135–145)
Total Bilirubin: 1.4 mg/dL — ABNORMAL HIGH (ref 0.3–1.2)
Total Protein: 8.9 g/dL — ABNORMAL HIGH (ref 6.5–8.1)

## 2022-05-06 LAB — CBG MONITORING, ED: Glucose-Capillary: 102 mg/dL — ABNORMAL HIGH (ref 70–99)

## 2022-05-06 LAB — TROPONIN I (HIGH SENSITIVITY)
Troponin I (High Sensitivity): 7 ng/L (ref <18)
Troponin I (High Sensitivity): 7 ng/L (ref <18)

## 2022-05-06 MED ORDER — IOHEXOL 350 MG/ML SOLN
100.0000 mL | Freq: Once | INTRAVENOUS | Status: AC | PRN
  Administered 2022-05-06: 100 mL via INTRAVENOUS

## 2022-05-06 NOTE — Progress Notes (Signed)
Plan of Care Note for deferred patient   Patient: [name] MRN: [id number]   DOA: 05/06/2022  Facility requesting transfer: [facility] Requesting Provider: Dalene Seltzer Reason for transfer: Syncope  Facility course: Patient with h/o NASH cirrhosis s/p remote liver transplant, CAD s/p CABG, and DM presenting with syncope.   Set up for vasovagal - LUQ pain, had BM.  Then had a syncopal event, found slumped on the commode.  Evaluation is normal.  It is reasonable to discharge and do outpatient echo.  She will discuss with the patient and he will likely be discharged from [facility].     Author: Jonah Blue, MD 05/06/2022  Check [email or web address] for on-call coverage.  Nursing staff, Please call [facility] Admits & Consults System-Wide number on Amion as soon as patient's arrival, so appropriate admitting provider can evaluate the pt.

## 2022-05-06 NOTE — ED Provider Notes (Signed)
[facility] EMERGENCY DEPT Provider Note   CSN: [id number] Arrival date & time: 05/06/22  4193     History  Chief Complaint  Patient presents with   Loss of Consciousness    RAFIK [name] is a 76 y.o. male.  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    Had  stomach ache, had not had anything to eat, upper/LUQ abdominal pain, had BM, no different than normal, not black or bloody, sort of loose-- went to the bathroom, next thing he knew he was waking up to wife. Does not remember feeling lightheaded.  Wife found him passed out, top part of body between sink and where he sitting on toilet. Didn't fall other than slumping over, don't think there was head trauma. No known falls.  Came back around quickly after that.  No shortness of breath or chest pain. No nausea or vomiting.  No abdominal pain now. No back or flank pain. No urinary symptoms, cough, Denies numbness, weakness, difficulty talking or walking, visual changes or facial droop.  No heart racing  No hx of syncope No recent medication changes     Sees Dr. Jeannetta Nap his PCP, Dr. Jacinto Halim for cardiology Denies alcohol, smoking or recreational drug use  Past Medical History:  Diagnosis Date   Arthritis    psoriatic   Cirrhosis (HCC)    Coronary artery disease    Diabetes mellitus without complication (HCC)    type 2   GERD (gastroesophageal reflux disease)    Low magnesium levels    Psoriasis      Home Medications Prior to Admission medications   Medication Sig Start Date End Date Taking? Authorizing Provider  aspirin EC 81 MG tablet Take 81 mg by mouth daily.    [provider]  atorvastatin (LIPITOR) 10 MG tablet TAKE 1 TABLET BY MOUTH EVERY DAY IN THE EVENING 06/25/19   Yates Decamp, MD  betamethasone  dipropionate 0.05 % cream Apply topically as needed.    [provider]  Calcium Carbonate-Vitamin D (CALCIUM + D PO) Take 1 tablet by mouth 2 (two) times daily.     [provider]  icosapent Ethyl (VASCEPA) 1 g capsule TAKE 2 CAPSULES BY MOUTH 2 TIMES DAILY. 09/06/21   Cantwell, Celeste C, PA-C  Insulin Glargine (BASAGLAR KWIKPEN [place]) Inject 1 mL into the skin.    [provider]  LANTUS SOLOSTAR 100 UNIT/ML Solostar Pen SMARTSIG:34 Unit(s) SUB-Q Daily 06/13/21   [provider]  Magnesium 300 MG CAPS Take 1 tablet by mouth daily. 07/27/20   [provider]  metFORMIN (GLUCOPHAGE) 500 MG tablet Take 500 mg by mouth daily.    [provider]  metoprolol succinate (TOPROL-XL) 100 MG 24 hr tablet Take by mouth daily.     [provider]  mycophenolate (MYFORTIC) 180 MG EC tablet Take 540 mg by mouth 2 (two) times daily.    [provider]  niacinamide 500 MG tablet niacinamide    [provider]  omeprazole (PRILOSEC) 20 MG capsule Take 20 mg by mouth daily.    [provider]  Omeprazole Magnesium (PRILOSEC PO) omeprazole    [provider]  tacrolimus (PROGRAF) 1 MG capsule Take 1 mg by mouth 2 (two) times daily.    [provider]  Allergies    Methotrexate    Review of Systems   Review of Systems  Physical Exam Updated Vital Signs BP 134/81   Pulse 75   Temp 97.8 F (36.6 C) (Oral)   Resp 20   SpO2 99%  Physical Exam Vitals and nursing note reviewed.  Constitutional:      General: He is not in acute distress.    Appearance: Normal appearance. He is well-developed. He is not ill-appearing or diaphoretic.  HENT:     Head: Normocephalic and atraumatic.  Eyes:     General: No visual field deficit.    Extraocular Movements: Extraocular movements intact.     Conjunctiva/sclera: Conjunctivae normal.     Pupils: Pupils are equal, round, and reactive to light.   Cardiovascular:     Rate and Rhythm: Normal rate and regular rhythm.     Pulses: Normal pulses.     Heart sounds: Normal heart sounds. No murmur heard.    No friction rub. No gallop.  Pulmonary:     Effort: Pulmonary effort is normal. No respiratory distress.     Breath sounds: Normal breath sounds. No wheezing or rales.  Abdominal:     General: There is no distension.     Palpations: Abdomen is soft.     Tenderness: There is no abdominal tenderness. There is no guarding.  Musculoskeletal:        General: No swelling or tenderness.     Cervical back: Normal range of motion.  Skin:    General: Skin is warm and dry.     Findings: No erythema or rash.  Neurological:     General: No focal deficit present.     Mental Status: He is alert and oriented to person, place, and time.     GCS: GCS eye subscore is 4. GCS verbal subscore is 5. GCS motor subscore is 6.     Cranial Nerves: No cranial nerve deficit, dysarthria or facial asymmetry.     Sensory: No sensory deficit.     Motor: No weakness or tremor.     Coordination: Coordination normal. Finger-Nose-Finger Test normal.     Gait: Gait normal.     ED Results / Procedures / Treatments   Labs (all labs ordered are listed, but only abnormal results are displayed) Labs Reviewed  CBC WITH DIFFERENTIAL/PLATELET - Abnormal; Notable for the following components:      Result Value   RBC 6.89 (*)    HCT 52.3 (*)    MCV 75.9 (*)    MCH 24.5 (*)    RDW 18.1 (*)    Neutro Abs 8.3 (*)    All other components within normal limits  COMPREHENSIVE METABOLIC PANEL - Abnormal; Notable for the following components:   Glucose, Bld 107 (*)    Creatinine, Ser 1.26 (*)    Calcium 10.7 (*)    Total Protein 8.9 (*)    Total Bilirubin 1.4 (*)    GFR, Estimated 59 (*)    All other components within normal limits  CBG MONITORING, ED - Abnormal; Notable for the following components:   Glucose-Capillary 102 (*)    All other components within normal  limits  URINALYSIS, ROUTINE W REFLEX MICROSCOPIC  TROPONIN I (HIGH SENSITIVITY)  TROPONIN I (HIGH SENSITIVITY)    EKG EKG Interpretation  Date/Time:  Sunday May 06 2022 08:51:00 EDT Ventricular Rate:  75 PR Interval:  194 QRS Duration: 86 QT Interval:  386 QTC Calculation: 432 R Axis:   -12  Text Interpretation: Sinus rhythm Probable left atrial enlargement Probable anteroseptal infarct, old No significant change since last tracing Confirmed by Alvira Monday ([id number]) on 05/06/2022 8:55:47 AM  Radiology CT Angio Chest/Abd/Pel for Dissection W and/or W/WO  Result Date: 05/06/2022 CLINICAL DATA:  Abdominal pain. Syncopal episode. Aortic aneurysm suspected. EXAM: CT ANGIOGRAPHY CHEST, ABDOMEN AND PELVIS TECHNIQUE: Non-contrast CT of the chest was initially obtained. Multidetector CT imaging through the chest, abdomen and pelvis was performed using the standard protocol during bolus administration of intravenous contrast. Multiplanar reconstructed images and MIPs were obtained and reviewed to evaluate the vascular anatomy. RADIATION DOSE REDUCTION: This exam was performed according to the departmental dose-optimization program which includes automated exposure control, adjustment of the mA and/or kV according to patient size and/or use of iterative reconstruction technique. CONTRAST:  OMNIPAQUE IOHEXOL 350 MG/ML SOLN COMPARISON:  Chest CTA 03/03/2005.  Abdominopelvic CT 02/22/2012. FINDINGS: CTA CHEST FINDINGS Cardiovascular: Pre contrast images demonstrate atherosclerosis of the aorta, great vessels and coronary arteries status post median sternotomy and CABG. No displaced intimal calcifications are identified within the aorta. Following contrast, the aorta enhances normally. There is no evidence of aneurysm or dissection. There is satisfactory opacification of the pulmonary arteries, and no evidence of acute pulmonary embolism. The heart size is normal. There is no pericardial effusion.  Mediastinum/Nodes: There are no enlarged mediastinal, hilar or axillary lymph nodes.Small hiatal hernia. The thyroid gland and trachea appear unremarkable. Lungs/Pleura: No pleural effusion or pneumothorax. Mild centrilobular and paraseptal emphysema with scattered subpleural reticulation which has progressed from remote chest CT. No honeycomb formation, airspace disease or suspicious pulmonary nodule. Musculoskeletal: No acute osseous findings. Previous median sternotomy. Mild bilateral gynecomastia. Review of the MIP images confirms the above findings. CTA ABDOMEN AND PELVIS FINDINGS VASCULAR Aorta: Normal caliber aorta without aneurysm, dissection, vasculitis or significant stenosis. Moderate atherosclerosis. Celiac: Patent without evidence of aneurysm, dissection, vasculitis or significant stenosis. Mild atherosclerosis. Conventional branching. SMA: Patent without evidence of aneurysm, dissection, vasculitis or significant stenosis. Mild atherosclerosis. Renals: Both renal arteries are patent without evidence of aneurysm, dissection, vasculitis, fibromuscular dysplasia or significant stenosis. Mild atherosclerosis. IMA: Patent without evidence of aneurysm, dissection, vasculitis or significant stenosis. Mild atherosclerosis. Inflow: Patent without evidence of aneurysm, dissection, vasculitis or significant stenosis. Mild atherosclerosis. Veins: No obvious venous abnormality within the limitations of this arterial phase study. Review of the MIP images confirms the above findings. NON-VASCULAR Hepatobiliary: Postsurgical findings consistent with previous liver transplant. No focal hepatic abnormality identified on noncontrast imaging. The left lobe appears atrophied. The gallbladder is surgically absent. No evidence of biliary dilatation. Pancreas: Unremarkable. No pancreatic ductal dilatation or surrounding inflammatory changes. Spleen: Normal in size without focal abnormality. Adrenals/Urinary Tract: Both  adrenal glands appear normal. There is mild renal cortical scarring bilaterally. No evidence of renal mass, hydronephrosis or urinary tract calculus. The bladder appears unremarkable for its degree of distention. Stomach/Bowel: No enteric contrast administered. The stomach appears unremarkable for its degree of distention. There is no evidence of bowel wall thickening, distention or surrounding inflammation. Diverticular changes are present within the descending and sigmoid colon. Lymphatic: No enlarged abdominopelvic lymph nodes. Reproductive: Mild enlargement of the prostate gland. Other: No ascites. As above, limited venous assessment. The mesenteric and portal veins are not opacified on this arterial phase study. 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 No acute osseous findings. Previous left proximal femoral ORIF. Review of the MIP images confirms the above findings. IMPRESSION: 1. No  acute vascular findings identified in the chest, abdomen or pelvis. No evidence of aortic aneurysm or dissection. 2. Diffuse aortic and branch vessel atherosclerosis post median sternotomy and CABG. Aortic Atherosclerosis (ICD10-I70.0). 3. Increased subpleural reticulation in both lungs without focal airspace disease or suspicious pulmonary nodularity. Emphysema (ICD10-J43.9). 4. Sequela of previous liver transplant. 5. Distal colonic diverticulosis. 6. 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 Electronically Signed   By: Carey Bullocks M.D.   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   DDx for syncope includes cardiac arrhythmia, MI, PE, electrolyte abnormality, hypovolemia including dehydration and anemia/GI bleed, infection, AAA rupture, vasovagal.   EKG completed and personally evaluated interpreted by me shows no evidence of acute changes or arrhythmia.  Labs are completed and personally evaluated interpreted by me show no significant anemia, electrolyte abnormality.  His creatinine is at baseline.  Troponin is negative, and at this time have low suspicion for ACS, however will repeat.  Urinalysis shows no signs of urinary tract infection.  No headache or neurologic symptoms to suggest intracranial etiology of syncope.  Given presence of abdominal pain preceding syncopal episode, CTA chest abdomen pelvis was ordered to evaluate for signs of aortic dissection or AAA.  CT completed and personally evaluated and interpreted by me and radiology shows diffuse aortic and branch vessel atherosclerosis.    Second troponin normal.  Discussed admission given history of CAD, syncope without preceding lightheadedness with patient and hospitalist Dr. Ophelia Charter who feels work up can be done as an outpatient. Again discussed option of admission versus close outpatient follow up with patient.  He would like to call Dr. Jacinto Halim tomorrow. Has been asymptomatic while in the ED, no arrhythmia. Recommend close outpatient follow up. Patient discharged in stable condition with understanding of reasons to return.          Final Clinical Impression(s) / ED Diagnoses Final diagnoses:  Syncope, unspecified syncope type    Rx / DC Orders ED Discharge Orders     None         Alvira Monday, MD 05/06/22 2134

## 2022-05-06 NOTE — ED Triage Notes (Signed)
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 Who tells me that, while having a b.m. this morning he had a brief syncope. This was accompanied by a brief wave of peri umbilical pain which has now completely resolved. He went to his local fire dep't., where "they checked me and they called 911 and they came and checked me and did an EKG" [sic]. His skin is normal, warm and dry and he  is breathing normally. His wife is with him. EKG brought with him is benign.

## 2022-07-25 ENCOUNTER — Ambulatory Visit: Payer: Medicare Other

## 2022-07-25 ENCOUNTER — Ambulatory Visit: Payer: Medicare Other | Admitting: Student

## 2022-07-25 ENCOUNTER — Other Ambulatory Visit: Payer: Medicare Other

## 2022-07-25 VITALS — BP 136/96 | HR 86 | Temp 98.3°F | Resp 16 | Ht 67.0 in | Wt 178.8 lb

## 2022-07-25 DIAGNOSIS — I10 Essential (primary) hypertension: Secondary | ICD-10-CM

## 2022-07-25 DIAGNOSIS — Z944 Liver transplant status: Secondary | ICD-10-CM

## 2022-07-25 DIAGNOSIS — E781 Pure hyperglyceridemia: Secondary | ICD-10-CM

## 2022-07-25 DIAGNOSIS — R55 Syncope and collapse: Secondary | ICD-10-CM

## 2022-07-25 DIAGNOSIS — I2510 Atherosclerotic heart disease of native coronary artery without angina pectoris: Secondary | ICD-10-CM

## 2022-07-25 NOTE — Progress Notes (Signed)
Primary Physician/Referring:  [name], MD  Patient ID: [name], male    DOB: 1945-12-30, 76 y.o.   MRN: [id number]  Chief Complaint  Patient presents with   Coronary Artery Disease   Hypertension   Hyperlipidemia   Follow-up    1 year   HPI:    [name]  is a 76 y.o.  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  Patient has done remarkably well, without complications from liver transplant.  Patient presents for annual follow-up.  He was seen in the ED on 05/06/2022 with syncopal event. He went to the bathroom, next thing he recalls was waking up to wife. Does not remember feeling lightheaded.  Wife found him passed out, top part of body between sink and where he sitting on toilet. EKG did not show evidence of ischemia. Work-up did not reveal any significant findings. Therefore, decision was made to follow-up with cardiology outpatient.  Patient is doing well overall without specific complaints today. He has not had any other syncopal events. Diabetes reportedly remains well controlled.  He denies chest pain, dyspnea.  Denies orthopnea, PND, leg edema.  He remains active around his home and property without issue.  Past Medical History:  Diagnosis Date   Arthritis    psoriatic   Cirrhosis (HCC)    Coronary artery disease    Diabetes mellitus without complication (HCC)    type 2   GERD (gastroesophageal reflux disease)    Low magnesium levels    Psoriasis   History reviewed. No pertinent family history. Past Surgical History:  Procedure Laterality Date   APPENDECTOMY     CHOLECYSTECTOMY     COLONOSCOPY WITH PROPOFOL N/A 12/27/2016   Procedure: COLONOSCOPY WITH PROPOFOL;  Surgeon: Charlott Rakes, MD;  Location: [facility]  ENDOSCOPY;  Service: Endoscopy;  Laterality: N/A;   CORONARY ARTERY BYPASS GRAFT  2005   x 4   EYE SURGERY Bilateral    ioc for cataracts   liver tranplant   2013 at [facility]   PATELLA FRACTURE SURGERY Left    surgery on broken leg Left 1991   multiple surgies with rod replaced and removed and redoved   Social History   Tobacco Use   Smoking status: Never   Smokeless tobacco: Never  Substance Use Topics   Alcohol use: No   Marital Status: Married  ROS  Review of Systems  Cardiovascular:  Negative for chest pain, claudication, dyspnea on exertion, leg swelling, near-syncope, orthopnea, palpitations, paroxysmal nocturnal dyspnea and syncope.  Respiratory:  Negative for shortness of breath.   Hematologic/Lymphatic: Does not bruise/bleed easily.  Musculoskeletal:  Positive for joint pain (left hip pain ).  All other systems reviewed and are negative.  Objective      07/25/2022    9:40 AM 07/25/2022    9:23 AM 05/06/2022    2:27 PM  Vitals with BMI  Height  5\' 7"    Weight  178 lbs 13 oz   BMI  28   Systolic 136 153 086  Diastolic 96 94 81  Pulse 86 71 75    Blood pressure (!) 136/96, pulse 86, temperature 98.3 F (36.8 C), temperature source Temporal, resp. rate 16, height 5\' 7"  (1.702 m), weight 178 lb 12.8 oz (81.1 kg), SpO2 94 %. Body mass index  is 28 kg/m.   Physical Exam Vitals reviewed.  Neck:     Thyroid: No thyromegaly.     Vascular: No JVD.  Cardiovascular:     Rate and Rhythm: Normal rate and regular rhythm.     Pulses: Intact distal pulses.          Carotid pulses are 2+ on the right side and 2+ on the left side.      Radial pulses are 2+ on the right side and 2+ on the left side.       Femoral pulses are 2+ on the right side and 2+ on the left side.      Popliteal pulses are 2+ on the right side and 2+ on the left side.       Dorsalis pedis pulses are 2+ on the right side and 2+ on the left side.       Posterior tibial pulses are 1+ on the right side and 2+  on the left side.     Heart sounds: Normal heart sounds, S1 normal and S2 normal. No murmur heard.    No gallop.  Pulmonary:     Effort: Pulmonary effort is normal. No respiratory distress.     Breath sounds: Normal breath sounds. No wheezing, rhonchi or rales.  Musculoskeletal:     Cervical back: Neck supple.     Right lower leg: No edema.     Left lower leg: No edema.  Skin:    General: Skin is warm and dry.  Neurological:     Mental Status: He is alert.    Laboratory examination:      Latest Ref Rng & Units 05/06/2022    9:03 AM 02/22/2012    7:02 PM 02/22/2012    6:30 PM  CMP  Glucose 70 - 99 mg/dL 107  138  148   BUN 8 - 23 mg/dL 17  17  17    Creatinine 0.61 - 1.24 mg/dL 1.26  0.93  0.91   Sodium 135 - 145 mmol/L 136  128  129   Potassium 3.5 - 5.1 mmol/L 4.6  4.7  4.5   Chloride 98 - 111 mmol/L 99  98  98   CO2 22 - 32 mmol/L 25  19  19    Calcium 8.9 - 10.3 mg/dL 10.7  8.8  8.9   Total Protein 6.5 - 8.1 g/dL 8.9  7.0  7.1   Total Bilirubin 0.3 - 1.2 mg/dL 1.4  5.9  5.4   Alkaline Phos 38 - 126 U/L 60  134  144   AST 15 - 41 U/L 22  65  68   ALT 0 - 44 U/L 24  34  34       Latest Ref Rng & Units 05/06/2022    9:03 AM 02/22/2012    7:02 PM 02/22/2012    6:03 PM  CBC  WBC 4.0 - 10.5 K/uL 10.5  6.6  5.9   Hemoglobin 13.0 - 17.0 g/dL 16.9  13.3  13.7   Hematocrit 39.0 - 52.0 % 52.3  37.0  38.1   Platelets 150 - 400 K/uL 197  47  50    Lipid Panel     Component Value Date/Time   CHOL 106 07/26/2021 0802   TRIG 106 07/26/2021 0802   HDL 39 (L) 07/26/2021 0802   LDLCALC 47 07/26/2021 0802   HEMOGLOBIN A1C Lab Results  Component Value Date   HGBA1C  12/10/2010    4.8 (NOTE)  According to the ADA Clinical Practice Recommendations for 2011, when HbA1c is used as a screening test:   >=6.5%   Diagnostic of Diabetes Mellitus           (if abnormal result  is confirmed)  5.7-6.4%   Increased risk of  developing Diabetes Mellitus  References:Diagnosis and Classification of Diabetes Mellitus,Diabetes [id number] 1):S62-S69 and Standards of Medical Care in         Diabetes - 2011,Diabetes [id number]  (Suppl 1):S11-S61.   MPG 91 12/10/2010   TSH No results for input(s): "TSH" in the last 8760 hours.  External Labs:   07/18/2022: Hemoglobin 15.9, hematocrit 44.3, MCV 74, platelet count 178 Hemoglobin A1c 6.7% Sodium 139, potassium 4.7, glucose 108, BUN 17, creatinine 1.17 Magnesium 1.5  06/12/2021: Cholesterol 120 triglycerides 98, HDL 42, LDL 59  11/02/2019: TSH 7.200  Allergies   Allergies  Allergen Reactions   Methotrexate Other (See Comments)    Liver failure    Medications Prior to Visit:   Outpatient Medications Prior to Visit  Medication Sig Dispense Refill   aspirin EC 81 MG tablet Take 81 mg by mouth daily.     atorvastatin (LIPITOR) 10 MG tablet TAKE 1 TABLET BY MOUTH EVERY DAY IN THE EVENING 90 tablet 0   betamethasone dipropionate 0.05 % cream Apply topically as needed.     Calcium Carbonate-Vitamin D (CALCIUM + D PO) Take 1 tablet by mouth 2 (two) times daily.      icosapent Ethyl (VASCEPA) 1 g capsule TAKE 2 CAPSULES BY MOUTH 2 TIMES DAILY. 360 capsule 3   Insulin Glargine (BASAGLAR KWIKPEN [place]) Inject 1 mL into the skin.     LANTUS SOLOSTAR 100 UNIT/ML Solostar Pen SMARTSIG:34 Unit(s) SUB-Q Daily     Magnesium 300 MG CAPS Take 1 tablet by mouth daily.     metFORMIN (GLUCOPHAGE) 500 MG tablet Take 500 mg by mouth daily.     mycophenolate (MYFORTIC) 180 MG EC tablet Take 540 mg by mouth 2 (two) times daily.     niacinamide 500 MG tablet niacinamide     omeprazole (PRILOSEC) 20 MG capsule Take 20 mg by mouth daily.     tacrolimus (PROGRAF) 1 MG capsule Take 1 mg by mouth 2 (two) times daily.     metoprolol succinate (TOPROL-XL) 100 MG 24 hr tablet Take by mouth daily.      Omeprazole Magnesium (PRILOSEC PO) omeprazole     No facility-administered  medications prior to visit.   Final Medications at End of Visit    Current Meds  Medication Sig   aspirin EC 81 MG tablet Take 81 mg by mouth daily.   atorvastatin (LIPITOR) 10 MG tablet TAKE 1 TABLET BY MOUTH EVERY DAY IN THE EVENING   betamethasone dipropionate 0.05 % cream Apply topically as needed.   Calcium Carbonate-Vitamin D (CALCIUM + D PO) Take 1 tablet by mouth 2 (two) times daily.    icosapent Ethyl (VASCEPA) 1 g capsule TAKE 2 CAPSULES BY MOUTH 2 TIMES DAILY.   Insulin Glargine (BASAGLAR KWIKPEN [place]) Inject 1 mL into the skin.   LANTUS SOLOSTAR 100 UNIT/ML Solostar Pen SMARTSIG:34 Unit(s) SUB-Q Daily   Magnesium 300 MG CAPS Take 1 tablet by mouth daily.   metFORMIN (GLUCOPHAGE) 500 MG tablet Take 500 mg by mouth daily.   mycophenolate (MYFORTIC) 180 MG EC tablet Take 540 mg by mouth 2 (two) times daily.   niacinamide 500 MG tablet niacinamide   omeprazole (PRILOSEC) 20 MG capsule Take 20  mg by mouth daily.   tacrolimus (PROGRAF) 1 MG capsule Take 1 mg by mouth 2 (two) times daily.   Radiology:   CT Angio Chest 05/06/2022: IMPRESSION: 1. No acute vascular findings identified in the chest, abdomen or pelvis. No evidence of aortic aneurysm or dissection. 2. Diffuse aortic and branch vessel atherosclerosis post median sternotomy and CABG. Aortic Atherosclerosis (ICD10-I70.0). 3. Increased subpleural reticulation in both lungs without focal airspace disease or suspicious pulmonary nodularity. Emphysema (ICD10-J43.9). 4. Sequela of previous liver transplant. 5. Distal colonic diverticulosis. 6. 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  Cardiac Studies:   S/P CABG 1995: Cardiac cath: 08/15/10:  LIMA to LAD, SVG to RI, SVG to PDA ectatic with sluggish flow (recommend medical therapy ) and Free left radial graft to OM2.  EF normal.  Lexiscan stress 07/28/10: Mid anterior and inferolateral ischemia. Normal LV.  EKG:   EKG  07/25/2022: Sinus rhythm with first-degree AV block at rate of 82 bpm.  Left axis deviation.  Biatrial enlargement.  Left anterior fascicular block.  Old anterior infarct. Compared to previous EKG on 07/25/2021, significant change.  Assessment     ICD-10-CM   1. Syncope and collapse  R55 LONG TERM MONITOR (3-14 DAYS)    PCV ECHOCARDIOGRAM COMPLETE    PCV MYOCARDIAL PERFUSION WO LEXISCAN    2. Coronary artery disease involving native coronary artery of native heart without angina pectoris  I25.10 EKG 12-Lead    PCV ECHOCARDIOGRAM COMPLETE    PCV MYOCARDIAL PERFUSION WO LEXISCAN    3. Hypertriglyceridemia  E78.1     4. Essential hypertension  I10     5. H/O liver transplant ([place]) 2013  Z94.4        No orders of the defined types were placed in this encounter.  Medications Discontinued During This Encounter  Medication Reason   Omeprazole Magnesium (PRILOSEC PO)    metoprolol succinate (TOPROL-XL) 100 MG 24 hr tablet      Recommendations:   [name]  is a 76 y.o. 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  Patient has done remarkably well, without complications from liver transplant.  Syncope and collapse During syncopal episode he was attempting to have a bowel movement, he does not recall straining and does not remember any prodromal symptoms prior to syncopal event. Feel that this event could have been vasovagal given circumstances around event and no other episodes however, given cardiac history will complete cardiac workup to rule out other causes of syncope including CAD, valve abnormalities, and arrhythmias.  Will schedule exercise stress test, echocardiogram, and place cardiac event monitor for 14 days.  Coronary artery disease  involving native coronary artery of native heart without angina pectoris No recurrence of chest pain. He is an appropriate medication therapy.  Hypertriglyceridemia Reviewed previous lipid profile completed 1 year ago, lipids are under good control. Continue Atorvastatin and Vascepa.  Essential hypertension Blood pressure is slightly elevated today but has been overall well controlled. Will not make changes to current medications at this time as he would like to hold off on starting antihypertensive agent. If blood pressure is elevated at next visit would consider adding losartan or other agent. Discussed low sodium diet less than 2000mg  daily and exercise at least 30 minutes 5 times per week.  H/O liver transplant ([place]) 2013 Followed by [facility]  Follow-up in 3 months or sooner if needed.   Ernst Spell, AGNP-C 07/25/2022, 10:08 AM Office: [phone] Pager: [phone]

## 2022-08-20 ENCOUNTER — Ambulatory Visit: Payer: Medicare Other

## 2022-08-20 DIAGNOSIS — R55 Syncope and collapse: Secondary | ICD-10-CM

## 2022-08-20 DIAGNOSIS — I2510 Atherosclerotic heart disease of native coronary artery without angina pectoris: Secondary | ICD-10-CM

## 2022-08-22 NOTE — Progress Notes (Signed)
Patient aware.

## 2022-10-25 ENCOUNTER — Ambulatory Visit: Payer: Medicare Other | Admitting: Cardiology

## 2022-10-25 ENCOUNTER — Ambulatory Visit: Payer: Medicare Other

## 2023-01-16 ENCOUNTER — Other Ambulatory Visit (HOSPITAL_COMMUNITY): Payer: Self-pay | Admitting: Urology

## 2023-01-16 DIAGNOSIS — C61 Malignant neoplasm of prostate: Secondary | ICD-10-CM

## 2023-02-04 ENCOUNTER — Encounter (HOSPITAL_COMMUNITY)
Admission: RE | Admit: 2023-02-04 | Discharge: 2023-02-04 | Disposition: A | Discharge status: Home / Self Care | Payer: Medicare HMO | Source: Ambulatory Visit | Attending: Urology | Admitting: Urology

## 2023-02-04 DIAGNOSIS — C61 Malignant neoplasm of prostate: Secondary | ICD-10-CM | POA: Diagnosis not present

## 2023-02-04 MED ORDER — PIFLIFOLASTAT F 18 (PYLARIFY) INJECTION
9.0000 | Freq: Once | INTRAVENOUS | Status: AC
  Administered 2023-02-04: 9 via INTRAVENOUS

## 2023-02-13 ENCOUNTER — Telehealth: Payer: Self-pay | Admitting: Radiation Oncology

## 2023-02-13 NOTE — Telephone Encounter (Signed)
5/15 @ 1:58 pm Left voicemail for patient to call our office to be schedule for consult with Dr. Kathrynn Running.

## 2023-02-14 NOTE — Progress Notes (Signed)
GU Location of Tumor / Histology: Prostate Ca  If Prostate Cancer, Gleason Score is (4 + 4) and PSA is (26.70 on 11/09/2022)  Biopsies     02/04/2023 Dr. Karoline Caldwell [place] PET (PSMA) [place] to Mid Thigh CLINICAL DATA: Prostate cancer   IMPRESSION: 1. Intense radiotracer activity involving the entirety of the prostate gland consistent with primary prostate adenocarcinoma. 2. There are bilateral very small nodules which have intense radiotracer activity. These nodules are new from comparison CT. While an atypical pattern, favor prostate cancer pulmonary metastasis. 3. No pelvic lymphadenopathy, retroperitoneal lymphadenopathy or thoracic metastatic adenopathy. 4. No liver metastasis or skeletal metastasis   Past/Anticipated interventions by urology, if any:  Dr. Benancio Deeds lab appointment next week per patient.  Past/Anticipated interventions by medical oncology, if any: NA  Weight changes, if any: No  IPSS:  12 SHIM:  20  Bowel/Bladder complaints, if any:  Bowels having too go more often loose due to liver issues.  Frequency urinary every 2 hours.  Nausea/Vomiting, if any: No  Pain issues, if any:  0/10  SAFETY ISSUES: Prior radiation? No Pacemaker/ICD? No Possible current pregnancy? Male Is the patient on methotrexate? No, allergic to this medication.  Current Complaints / other details:  Not at this time.

## 2023-02-18 DIAGNOSIS — C61 Malignant neoplasm of prostate: Secondary | ICD-10-CM | POA: Insufficient documentation

## 2023-02-19 ENCOUNTER — Ambulatory Visit
Admission: RE | Admit: 2023-02-19 | Discharge: 2023-02-19 | Disposition: A | Discharge status: Home / Self Care | Payer: BC Managed Care – PPO | Source: Ambulatory Visit | Attending: Radiation Oncology | Admitting: Radiation Oncology

## 2023-02-19 ENCOUNTER — Other Ambulatory Visit: Payer: Self-pay

## 2023-02-19 ENCOUNTER — Encounter: Payer: Self-pay | Admitting: Radiation Oncology

## 2023-02-19 VITALS — BP 135/93 | HR 75 | Temp 97.2°F | Resp 20 | Wt 177.0 lb

## 2023-02-19 DIAGNOSIS — E119 Type 2 diabetes mellitus without complications: Secondary | ICD-10-CM | POA: Diagnosis not present

## 2023-02-19 DIAGNOSIS — Z7982 Long term (current) use of aspirin: Secondary | ICD-10-CM | POA: Diagnosis not present

## 2023-02-19 DIAGNOSIS — I2510 Atherosclerotic heart disease of native coronary artery without angina pectoris: Secondary | ICD-10-CM | POA: Diagnosis not present

## 2023-02-19 DIAGNOSIS — L4050 Arthropathic psoriasis, unspecified: Secondary | ICD-10-CM | POA: Diagnosis not present

## 2023-02-19 DIAGNOSIS — C7800 Secondary malignant neoplasm of unspecified lung: Secondary | ICD-10-CM | POA: Insufficient documentation

## 2023-02-19 DIAGNOSIS — C61 Malignant neoplasm of prostate: Secondary | ICD-10-CM | POA: Insufficient documentation

## 2023-02-19 DIAGNOSIS — Z79899 Other long term (current) drug therapy: Secondary | ICD-10-CM | POA: Insufficient documentation

## 2023-02-19 DIAGNOSIS — K7460 Unspecified cirrhosis of liver: Secondary | ICD-10-CM | POA: Diagnosis not present

## 2023-02-19 DIAGNOSIS — Z944 Liver transplant status: Secondary | ICD-10-CM | POA: Insufficient documentation

## 2023-02-19 DIAGNOSIS — Z7984 Long term (current) use of oral hypoglycemic drugs: Secondary | ICD-10-CM | POA: Insufficient documentation

## 2023-02-19 NOTE — Progress Notes (Signed)
Introduced myself to the patient, and his wife, as the prostate nurse navigator.  He is here to discuss his radiation treatment options.  I gave him my business card and asked him to call me with questions or concerns.  Verbalized understanding.   Patient's wife had questions regarding when the next time they would get a refill on ADT.  RN left message with patient's wife and patient to follow up to confirm next ADT appointment.

## 2023-02-19 NOTE — Progress Notes (Signed)
Radiation Oncology         [phone] ________________________________  Initial Outpatient Consultation  Name: [name] MRN: [id number]  Date: 02/19/2023  DOB: [birth year]  IR:CVELFY, Curly Rim, MD  Belva Agee, MD   REFERRING PHYSICIAN: Belva Agee, MD  DIAGNOSIS: 77 y.o. 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    ICD-10-CM   1. Malignant neoplasm of prostate (HCC)  C61       HISTORY OF PRESENT ILLNESS: [name] is a 77 y.o. male with a diagnosis of prostate cancer.  He was noted to have an elevated PSA of 27 on routine labs by his primary care physician, Dr. Jeannetta Nap in October 2023.  Accordingly, he was referred for evaluation in urology by Dr. Benancio Deeds on 11/08/22,  digital rectal examination performed at that time showed left-sided firmness and asymmetry with L lobe > R lobe. A repeat PSA obtained that day showed stable but persistent elevation at 26.7.  Therefore, the patient proceeded to transrectal ultrasound with 14 biopsies of the prostate on 12/24/22.  The prostate volume measured 37.24 cc.  Out of 14 core biopsies, 13 were positive.  The maximum Gleason score was 4+4, and this was seen in 5/6 left-sided cores (with perineural and angiolymphatic invasion present in the left base), as well as the right base, right mid (with PNI), right apex (both cores), and right apex lateral (both cores). Additionally, Gleason 4+3 was seen in the right base lateral and right mid lateral (both with PNI).  He underwent staging PSMA PET scan on 02/04/23 showing intense radiotracer activity involving entirety of prostate gland as well as multiple  small, bilateral pulmonary nodules with intense radiotracer activity, new from prior CT in 05/2022 but no lymphadenopathy, liver metastasis, or skeletal metastasis.   The patient reviewed the biopsy results with his urologist  and was started on Orgovyx ADT 02/08/2023.  He has kindly been referred today for discussion of potential radiation treatment options.  Of note, he is s/p liver transplant in 2013, on immunosuppressive medication.  PREVIOUS RADIATION THERAPY: No  PAST MEDICAL HISTORY:  Past Medical History:  Diagnosis Date   Arthritis    psoriatic   Cirrhosis (HCC)    Coronary artery disease    Diabetes mellitus without complication (HCC)    type 2   GERD (gastroesophageal reflux disease)    Low magnesium levels    Psoriasis       PAST SURGICAL HISTORY: Past Surgical History:  Procedure Laterality Date   APPENDECTOMY     CHOLECYSTECTOMY     COLONOSCOPY WITH PROPOFOL N/A 12/27/2016   Procedure: COLONOSCOPY WITH PROPOFOL;  Surgeon: Charlott Rakes, MD;  Location: [facility];  Service: Endoscopy;  Laterality: N/A;   CORONARY ARTERY BYPASS GRAFT  2005   x 4   EYE SURGERY Bilateral    ioc for cataracts   liver tranplant   2013 at [facility]   PATELLA FRACTURE SURGERY Left    PROSTATE BIOPSY     surgery on broken leg Left 1991   multiple surgies with rod replaced and removed and redoved    FAMILY HISTORY: No family history on file.  SOCIAL HISTORY:  Social History   Socioeconomic History   Marital status: Married    Spouse name: Not on file   Number of children: 5   Years of education: Not on file   Highest education level: Not  on file  Occupational History   Not on file  Tobacco Use   Smoking status: Never   Smokeless tobacco: Never  Vaping Use   Vaping Use: Never used  Substance and Sexual Activity   Alcohol use: No   Drug use: No   Sexual activity: Not Currently  Other Topics Concern   Not on file  Social History Narrative   Not on file   Social Determinants of Health   Financial Resource Strain: Not on file  Food Insecurity: No Food Insecurity (02/19/2023)   Hunger Vital Sign    Worried About Running Out of Food in the Last Year: Never true    Ran Out of Food in the Last  Year: Never true  Transportation Needs: No Transportation Needs (02/19/2023)   PRAPARE - Administrator, Civil Service (Medical): No    Lack of Transportation (Non-Medical): No  Physical Activity: Not on file  Stress: Not on file  Social Connections: Not on file  Intimate Partner Violence: Not At Risk (02/19/2023)   Humiliation, Afraid, Rape, and Kick questionnaire    Fear of Current or Ex-Partner: No    Emotionally Abused: No    Physically Abused: No    Sexually Abused: No    ALLERGIES: Methotrexate  MEDICATIONS:  Current Outpatient Medications  Medication Sig Dispense Refill   metFORMIN (GLUCOPHAGE) 1000 MG tablet Take 1,000 mg by mouth 2 (two) times daily.     ONETOUCH ULTRA test strip USE TO TEST TWICE DAILY     sildenafil (VIAGRA) 100 MG tablet SMARTSIG:0.5-1 Tablet(s) By Mouth PRN     tacrolimus (PROGRAF) 0.5 MG capsule Take by mouth.     aspirin EC 81 MG tablet Take 81 mg by mouth daily.     atorvastatin (LIPITOR) 10 MG tablet TAKE 1 TABLET BY MOUTH EVERY DAY IN THE EVENING 90 tablet 0   betamethasone dipropionate 0.05 % cream Apply topically as needed.     Calcium Carbonate-Vitamin D (CALCIUM + D PO) Take 1 tablet by mouth 2 (two) times daily.      icosapent Ethyl (VASCEPA) 1 g capsule TAKE 2 CAPSULES BY MOUTH 2 TIMES DAILY. 360 capsule 3   Insulin Glargine (BASAGLAR KWIKPEN Gilchrist) Inject 1 mL into the skin.     LANTUS SOLOSTAR 100 UNIT/ML Solostar Pen SMARTSIG:34 Unit(s) SUB-Q Daily     Magnesium 300 MG CAPS Take 1 tablet by mouth daily.     niacinamide 500 MG tablet niacinamide     omeprazole (PRILOSEC) 20 MG capsule Take 20 mg by mouth daily.     No current facility-administered medications for this encounter.    REVIEW OF SYSTEMS:  On review of systems, the patient reports that he is doing well overall. He denies any chest pain, shortness of breath, cough, fevers, chills, night sweats, unintended weight changes. He denies abdominal pain, nausea or vomiting. He  reports loose and frequent bowel movements due to liver issues. He denies any new musculoskeletal or joint aches or pains. His IPSS was 12, indicating moderate urinary symptoms. He reports urinary frequency every 2 hours. His SHIM was 20, indicating he has mild erectile dysfunction. A complete review of systems is obtained and is otherwise negative.    PHYSICAL EXAM:  Wt Readings from Last 3 Encounters:  02/19/23 177 lb (80.3 kg)  07/25/22 178 lb 12.8 oz (81.1 kg)  07/25/21 199 lb (90.3 kg)   Temp Readings from Last 3 Encounters:  02/19/23 (!) 97.2 F (36.2 C)  07/25/22 98.3 F (36.8 C) (Temporal)  05/06/22 97.8 F (36.6 C) (Oral)   BP Readings from Last 3 Encounters:  02/19/23 (!) 135/93  07/25/22 (!) 136/96  05/06/22 134/81   Pulse Readings from Last 3 Encounters:  02/19/23 75  07/25/22 86  05/06/22 75   Pain Assessment Pain Score: 0-No pain/10  In general this is a well appearing Caucasian male in no acute distress. He's alert and oriented x4 and appropriate throughout the examination. Cardiopulmonary assessment is negative for acute distress, and he exhibits normal effort.     KPS = 100  100 - Normal; no complaints; no evidence of disease. 90   - Able to carry on normal activity; minor signs or symptoms of disease. 80   - Normal activity with effort; some signs or symptoms of disease. 39   - Cares for self; unable to carry on normal activity or to do active work. 60   - Requires occasional assistance, but is able to care for most of his personal needs. 50   - Requires considerable assistance and frequent medical care. 40   - Disabled; requires special care and assistance. 30   - Severely disabled; hospital admission is indicated although death not imminent. 20   - Very sick; hospital admission necessary; active supportive treatment necessary. 10   - Moribund; fatal processes progressing rapidly. 0     - Dead  Karnofsky DA, Abelmann WH, Craver LS and Burchenal [facility]  [phone]) The use of the nitrogen mustards in the palliative treatment of carcinoma: with particular reference to bronchogenic carcinoma Cancer 1 634-56  LABORATORY DATA:  Lab Results  Component Value Date   WBC 10.5 05/06/2022   HGB 16.9 05/06/2022   HCT 52.3 (H) 05/06/2022   MCV 75.9 (L) 05/06/2022   PLT 197 05/06/2022   Lab Results  Component Value Date   NA 136 05/06/2022   K 4.6 05/06/2022   CL 99 05/06/2022   CO2 25 05/06/2022   Lab Results  Component Value Date   ALT 24 05/06/2022   AST 22 05/06/2022   ALKPHOS 60 05/06/2022   BILITOT 1.4 (H) 05/06/2022     RADIOGRAPHY: NM PET (PSMA) SKULL TO MID THIGH  Result Date: 02/06/2023 CLINICAL DATA:  Prostate cancer EXAM: NUCLEAR MEDICINE PET SKULL BASE TO THIGH TECHNIQUE: 9.0 mCi F18 Piflufolastat (Pylarify) was injected intravenously. Full-ring PET imaging was performed from the skull base to thigh after the radiotracer. CT data was obtained and used for attenuation correction and anatomic localization. COMPARISON:  CT chest abdomen pelvis 05/06/2022 FINDINGS: NECK No radiotracer activity in neck lymph nodes. Incidental CT finding: None. CHEST Several very small pulmonary nodules with intense radiotracer activity. These nodules are new from CT 1 year prior. Example: RIGHT upper lobe 4 mm nodule (image 33/7) with SUV max equal 5.5. Peripheral RIGHT middle lobe nodule measuring 4 mm (48/7) with SUV max 19. Two nodules in the LEFT lobe are difficult define on the CT (SUV max equal 5.2 on image 102). There are approximately 5 radiotracer avid nodules in the RIGHT lung and 3 in LEFT lung. No radiotracer avid mediastinal or supraclavicular lymph nodes. Incidental CT finding: Post CABG ABDOMEN/PELVIS Prostate: Intense radiotracer avid involving the entirety of the prostate gland SUV max equal 56. Lymph nodes: No abnormal radiotracer accumulation within pelvic or abdominal nodes. Liver: No evidence of liver metastasis. Incidental CT finding:  Atherosclerotic calcification of the aorta. SKELETON No focal activity to suggest skeletal metastasis. No sclerotic or lytic lesions  on CT imaging. LEFT femur medullary nail fixation IMPRESSION: 1. Intense radiotracer activity involving the entirety of the prostate gland consistent with primary prostate adenocarcinoma. 2. There are bilateral very small nodules which have intense radiotracer activity. These nodules are new from comparison CT. While an atypical pattern, favor prostate cancer pulmonary metastasis. 3. No pelvic lymphadenopathy, retroperitoneal lymphadenopathy or thoracic metastatic adenopathy. 4. No liver metastasis or skeletal metastasis Electronically Signed   By: Genevive Bi M.D.   On: 02/06/2023 16:59      IMPRESSION/PLAN: 1. 77 y.o. gentleman with Stage IVB adenocarcinoma of the prostate with Gleason Score of 4+4, PSA of 26.7 low-volume metastatic disease involving the lungs. We discussed the patient's workup and outlined the nature of prostate cancer in this setting. The patient's T stage, Gleason's score, and PSA put him into the high risk group and PSMA PET staging study confirms low-volume metastatic disease in the lungs bilaterally.  Given his low burden metastatic disease, he is eligible for definitive treatment with LT-ADT +/- multimodal androgen blockade, concurrent with 8 weeks of external radiation. We discussed the available radiation techniques, and focused on the details and logistics of delivery. We discussed and outlined the risks, benefits, short and long-term effects associated with radiotherapy and compared and contrasted these with prostatectomy. We discussed the role of SpaceOAR gel in reducing the rectal toxicity associated with radiotherapy. We also detailed the role of ADT in the treatment of high risk prostate cancer and outlined the associated side effects that could be expected with this therapy. He appears to have a good understanding of his disease and our  treatment recommendations which are of curative intent.  He understands that the likelihood for cure is relatively low but that we have high probability of obtaining good durable control of his disease with an aggressive treatment approach.  He was encouraged to ask questions that were answered to his stated satisfaction.  At the conclusion of our conversation, the patient is interested in moving forward with 8 weeks of external beam therapy concurrent with ADT. He has started ADT with Orgovyx on 02/08/2023 and is interested in meeting with medical oncology to discuss the potential role of treatment escalation with additional systemic therapy. We will share our discussion with Dr. Benancio Deeds and make arrangements for fiducial markers and SpaceOAR gel placement in July 2024, prior to simulation, to reduce rectal toxicity from radiotherapy. The patient appears to have a good understanding of his disease and our treatment recommendations which are of curative intent and is in agreement with the stated plan.  Therefore, we will move forward with treatment planning accordingly, in anticipation of beginning IMRT approximately 2 months after starting ADT.  We enjoyed meeting him and his wife today and look forward to continuing to participate in his care.  They know that they are welcome to call at anytime with any additional questions or concerns related to the treatment recommendation discussed today.  We personally spent 70 minutes in this encounter including chart review, reviewing radiological studies, meeting face-to-face with the patient, entering orders and completing documentation.    Marguarite Arbour, PA-C    [name], MD  [facility]  Radiation Oncology Direct Dial: [phone]  Fax: [phone] [email or web address]  Skype  [email or web address]   This document serves as a record of services personally performed by [name], MD and Marcello Fennel, PA-C. It was created on their behalf by Mickie Bail, a trained medical scribe. The creation of this record is based on the scribe's  personal observations and the provider's statements to them. This document has been checked and approved by the attending provider.

## 2023-02-21 ENCOUNTER — Telehealth: Payer: Self-pay | Admitting: Hematology

## 2023-02-21 NOTE — Telephone Encounter (Signed)
scheduled per referral, pt has been called and confirmed date and time. Pt is aware of location and to arrive early for check in   

## 2023-02-21 NOTE — Progress Notes (Signed)
RN left message for call back to confirm upcoming ADT and Med Onc appointments.

## 2023-02-26 ENCOUNTER — Telehealth: Payer: Self-pay | Admitting: Urology

## 2023-02-27 NOTE — Telephone Encounter (Signed)
Opened in error

## 2023-03-04 NOTE — Progress Notes (Signed)
RN spoke with patient's wife to confirm and clarify upcoming Med Onc appointment, and [facility] appointment.

## 2023-03-06 ENCOUNTER — Telehealth: Payer: Self-pay | Admitting: *Deleted

## 2023-03-06 ENCOUNTER — Other Ambulatory Visit: Payer: Self-pay | Admitting: Urology

## 2023-03-06 NOTE — Telephone Encounter (Signed)
CALLED PATIENT TO INFORM OF FID.MARKER AND SPACE OAR PLACEMENT FOR 04-09-23 AND HIS SIM APPT. ON 04-26-23 @ [facility], INFORMED PATIENT TO ARRIVE WITH A FULL BLADDER, SPOKE WITH PATIENT AND HE IS AWARE OF THESE APPTS. AND THE INSTRUCTIONS

## 2023-03-07 ENCOUNTER — Other Ambulatory Visit: Payer: Self-pay

## 2023-03-07 ENCOUNTER — Inpatient Hospital Stay: Payer: Medicare HMO | Attending: Hematology | Admitting: Hematology

## 2023-03-07 ENCOUNTER — Encounter: Payer: Self-pay | Admitting: Hematology

## 2023-03-07 ENCOUNTER — Other Ambulatory Visit (HOSPITAL_COMMUNITY): Payer: Self-pay

## 2023-03-07 ENCOUNTER — Inpatient Hospital Stay: Payer: Medicare HMO

## 2023-03-07 VITALS — BP 126/88 | HR 81 | Temp 97.7°F | Resp 16 | Ht 66.0 in | Wt 178.0 lb

## 2023-03-07 DIAGNOSIS — Z79899 Other long term (current) drug therapy: Secondary | ICD-10-CM | POA: Diagnosis not present

## 2023-03-07 DIAGNOSIS — Z7952 Long term (current) use of systemic steroids: Secondary | ICD-10-CM | POA: Diagnosis not present

## 2023-03-07 DIAGNOSIS — M129 Arthropathy, unspecified: Secondary | ICD-10-CM | POA: Insufficient documentation

## 2023-03-07 DIAGNOSIS — E119 Type 2 diabetes mellitus without complications: Secondary | ICD-10-CM | POA: Insufficient documentation

## 2023-03-07 DIAGNOSIS — Z191 Hormone sensitive malignancy status: Secondary | ICD-10-CM | POA: Diagnosis not present

## 2023-03-07 DIAGNOSIS — Z794 Long term (current) use of insulin: Secondary | ICD-10-CM | POA: Insufficient documentation

## 2023-03-07 DIAGNOSIS — Z7982 Long term (current) use of aspirin: Secondary | ICD-10-CM | POA: Insufficient documentation

## 2023-03-07 DIAGNOSIS — K219 Gastro-esophageal reflux disease without esophagitis: Secondary | ICD-10-CM | POA: Insufficient documentation

## 2023-03-07 DIAGNOSIS — Z79621 Long term (current) use of calcineurin inhibitor: Secondary | ICD-10-CM | POA: Insufficient documentation

## 2023-03-07 DIAGNOSIS — Z7984 Long term (current) use of oral hypoglycemic drugs: Secondary | ICD-10-CM | POA: Diagnosis not present

## 2023-03-07 DIAGNOSIS — C7802 Secondary malignant neoplasm of left lung: Secondary | ICD-10-CM | POA: Diagnosis not present

## 2023-03-07 DIAGNOSIS — K7460 Unspecified cirrhosis of liver: Secondary | ICD-10-CM | POA: Insufficient documentation

## 2023-03-07 DIAGNOSIS — C61 Malignant neoplasm of prostate: Secondary | ICD-10-CM

## 2023-03-07 DIAGNOSIS — I2510 Atherosclerotic heart disease of native coronary artery without angina pectoris: Secondary | ICD-10-CM | POA: Diagnosis not present

## 2023-03-07 DIAGNOSIS — Z944 Liver transplant status: Secondary | ICD-10-CM | POA: Diagnosis not present

## 2023-03-07 LAB — COMPREHENSIVE METABOLIC PANEL
ALT: 65 U/L — ABNORMAL HIGH (ref 0–44)
AST: 31 U/L (ref 15–41)
Albumin: 4 g/dL (ref 3.5–5.0)
Alkaline Phosphatase: 99 U/L (ref 38–126)
Anion gap: 6 (ref 5–15)
BUN: 21 mg/dL (ref 8–23)
CO2: 27 mmol/L (ref 22–32)
Calcium: 9.8 mg/dL (ref 8.9–10.3)
Chloride: 102 mmol/L (ref 98–111)
Creatinine, Ser: 1.39 mg/dL — ABNORMAL HIGH (ref 0.61–1.24)
GFR, Estimated: 52 mL/min — ABNORMAL LOW (ref >60)
Glucose, Bld: 131 mg/dL — ABNORMAL HIGH (ref 70–99)
Potassium: 4.8 mmol/L (ref 3.5–5.1)
Sodium: 135 mmol/L (ref 135–145)
Total Bilirubin: 0.5 mg/dL (ref 0.3–1.2)
Total Protein: 7.8 g/dL (ref 6.5–8.1)

## 2023-03-07 LAB — CBC WITH DIFFERENTIAL/PLATELET
Abs Immature Granulocytes: 0.03 10*3/uL (ref 0.00–0.07)
Basophils Absolute: 0 10*3/uL (ref 0.0–0.1)
Basophils Relative: 1 %
Eosinophils Absolute: 0.3 10*3/uL (ref 0.0–0.5)
Eosinophils Relative: 3 %
HCT: 45.2 % (ref 39.0–52.0)
Hemoglobin: 14.7 g/dL (ref 13.0–17.0)
Immature Granulocytes: 0 %
Lymphocytes Relative: 28 %
Lymphs Abs: 2.4 10*3/uL (ref 0.7–4.0)
MCH: 24.6 pg — ABNORMAL LOW (ref 26.0–34.0)
MCHC: 32.5 g/dL (ref 30.0–36.0)
MCV: 75.6 fL — ABNORMAL LOW (ref 80.0–100.0)
Monocytes Absolute: 0.5 10*3/uL (ref 0.1–1.0)
Monocytes Relative: 6 %
Neutro Abs: 5.2 10*3/uL (ref 1.7–7.7)
Neutrophils Relative %: 62 %
Platelets: 209 10*3/uL (ref 150–400)
RBC: 5.98 MIL/uL — ABNORMAL HIGH (ref 4.22–5.81)
RDW: 15.3 % (ref 11.5–15.5)
WBC: 8.4 10*3/uL (ref 4.0–10.5)
nRBC: 0 % (ref 0.0–0.2)

## 2023-03-07 MED ORDER — ABIRATERONE ACETATE 250 MG PO TABS
1000.0000 mg | ORAL_TABLET | Freq: Every day | ORAL | 2 refills | Status: DC
  Filled 2023-03-07 – 2023-03-11 (×2): qty 120, 30d supply, fill #0
  Filled 2023-04-08 (×2): qty 120, 30d supply, fill #1
  Filled 2023-05-01: qty 120, 30d supply, fill #2

## 2023-03-07 MED ORDER — RELUGOLIX 120 MG PO TABS
120.0000 mg | ORAL_TABLET | Freq: Every day | ORAL | 3 refills | Status: DC
  Filled 2023-03-07 – 2023-03-11 (×3): qty 30, 30d supply, fill #0

## 2023-03-07 MED ORDER — PREDNISONE 5 MG PO TABS
5.0000 mg | ORAL_TABLET | Freq: Every day | ORAL | 1 refills | Status: DC
  Filled 2023-03-07 – 2023-03-11 (×2): qty 90, 90d supply, fill #0
  Filled 2023-04-09 – 2023-06-07 (×2): qty 90, 90d supply, fill #1

## 2023-03-07 NOTE — Progress Notes (Signed)
[facility]   [phone] [phone]   Clinic New Consult Note   Patient Care Team: [name], MD as PCP - General (Family Medicine) Cherlyn Cushing, RN as Oncology Nurse Navigator  Date of Service:  03/07/2023   CHIEF COMPLAINTS/PURPOSE OF CONSULTATION:  Metastatic Prostate Cancer  REFERRING PHYSICIAN:  Belva Agee, MD  ASSESSMENT & PLAN:  [name] is a 77 y.o.  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  He is asymptomatic, without significant urinary symptoms or other new symptoms. -I reviewed his prostate biopsy results, 11 out of 12 cores showed prostate adenocarcinoma, with Gleason score 4+4 = 8, this is a high risk disease. -I personally reviewed his PSMA scan images with patient and his wife in detail, which showed significant hypermetabolic uptake in the entire prostate, and multiple hypermetabolic small lung nodules, consistent with pulmonary metastasis.  No bone, lymph nodes or liver metastasis, he has low volume metastasis.  -We discussed his incurable nature of prostate cancer, and a systemic treatment is his main therapy -He has seen urologist Dr. Charlestine Massed and started androgen deprivation with Orgovyx loading dose for first month, I will refill for him today. -Patient has met radiation oncologist Dr. Kathrynn Running, given his low volume of metastatic disease, did definitive radiation with LT-ADT +/- multimodal androgen blockade, concurrent with 8 weeks of external radiation was offered and he will start in July. -I discussed the other systemic treatment options.  Due to the high risk disease and vesical metastasis, I recommend adding Zytiga 1000 mg daily and prednisone 5 mg daily in addition to ADT.  Benefit and potential side effects, which  includes but not limited to, fatigue, hot flashes, hypertension, fluid retention, arrhythmia, liver toxicity, osteoporosis, etc. were discussed with patient in detail, he voiced good understanding and agrees to proceed.  I called in to [facility] pharmacy today.  Plan to start when he received it. -I do not think he needs triple therapy at this point, given the low volume of metastatic disease, his advanced age and medical comorbidities.  I discussed option of chemotherapy, Pluvicto, and potential targeted therapy in the future if he needs. -will see him back in 6 weeks for f/u   2.  Liver cirrhosis status post liver transplant at the [facility] -Will monitor his liver function when he is on cancer treatment -He will continue tacrolimus and follow-up with his transplant team at the [facility]  3. DM -f/u with PCP and continue meds     PLAN:  -lab today  -discuss the biopsy results  -Discuss additional treatment with Zytiga and prednisone and its side effects, I called in today and also refilled Orgovyx  -Recommend Radiation -lab and f/u in 6 weeks   Oncology History Overview Note   Cancer Staging  Malignant neoplasm of prostate [facility]) Staging form: Prostate, AJCC 8th Edition - Clinical stage from 12/24/2022: Stage IVB (cT2a, cN0, cM1c, PSA: 27, Grade Group: 4) - Signed by Marcello Fennel, PA-C on 02/18/2023 Histopathologic type: Adenocarcinoma, NOS Stage prefix: Initial diagnosis Prostate specific antigen (PSA) range: 20 or greater Gleason primary pattern: 4 Gleason secondary pattern: 4 Gleason score: 8 Histologic grading system: 5 grade system Number of biopsy cores examined: 12 Number of biopsy cores positive: 11 Location of positive  needle core biopsies: Both sides     Malignant neoplasm of prostate (HCC)  12/24/2022 Cancer Staging   Staging form: Prostate, AJCC 8th Edition - Clinical stage from 12/24/2022: Stage IVB (cT2a, cN0, cM1c, PSA: 27, Grade Group: 4) -  Signed by Marcello Fennel, PA-C on 02/18/2023 Histopathologic type: Adenocarcinoma, NOS Stage prefix: Initial diagnosis Prostate specific antigen (PSA) range: 20 or greater Gleason primary pattern: 4 Gleason secondary pattern: 4 Gleason score: 8 Histologic grading system: 5 grade system Number of biopsy cores examined: 12 Number of biopsy cores positive: 11 Location of positive needle core biopsies: Both sides   02/04/2023 PET scan    IMPRESSION: 1. Intense radiotracer activity involving the entirety of the prostate gland consistent with primary prostate adenocarcinoma. 2. There are bilateral very small nodules which have intense radiotracer activity. These nodules are new from comparison CT. While an atypical pattern, favor prostate cancer pulmonary metastasis. 3. No pelvic lymphadenopathy, retroperitoneal lymphadenopathy or thoracic metastatic adenopathy. 4. No liver metastasis or skeletal metastasis     02/18/2023 Initial Diagnosis   Malignant neoplasm of prostate [facility])    Pathology Results   Prostate Adenocarcinoma Gleason Score 4+4=8  PSA: 26.7      HISTORY OF PRESENTING ILLNESS:  [name] 77 y.o. male is a here because of Metastatic Prostate Cancer. The patient was referred by Belva Agee, MD. The patient presents to the clinic today accompanied by wife..   Pt had no symptoms. Pt had a  screening  PSA in October 2023 and it was elevated at 27.  He had repeated a PSA in February 2024 which was 26.7.  He was referred to [facility] urology and met Dr. Benancio Deeds.  He underwent prostate core biopsy on December 24, 2022.  Out of 12 core biopsy, he had 11 biopsy showed positive prostate adenocarcinoma, with Gleason score 4+4 = 8.  PSMA scan was obtained on Feb 04, 2023, which showed significant hypermetabolic activity in the entire prostate, and multiple small hypermetabolic lung nodules.  He was started on Orgovyx in early May 2024.  He also met radiation oncologist Dr.  Kathrynn Running on Feb 19, 2023, and definitive radiation was recommended and planted to start in July.  He had liver cirrhosis secondary to methotrexate, and underwent a liver transplant at [facility] in 2013.  He is on tacrolimus.  He lives with his wife, functions well, denies any significant pain or other new symptoms.  His weight has been stable lately.  He has a PMHx of.... Arthritis  Diabetes Mellitus GERD Liver transplant Gall Bladder Psoriasis and psoriatic arthritis    Socially... Married  5 children Retired Engineer, site  REVIEW OF SYSTEMS:    Constitutional: (-)Denies fevers, chills or abnormal night sweats Eyes: (-) Denies blurriness of vision, double vision or watery eyes Ears, nose, mouth, throat, and face: Denies mucositis or sore throat Respiratory: (-) Denies cough, dyspnea or wheezes Cardiovascular: (-) Denies palpitation, chest discomfort or lower extremity swelling Gastrointestinal: (-)  Denies nausea, heartburn or change in bowel habits Skin: (-) Denies abnormal skin rashes Lymphatics:(-)  Denies new lymphadenopathy or easy bruising Neurological:(-) Denies numbness, tingling or new weaknesses Behavioral/Psych:(-)  Mood is stable, no new changes  All other systems were reviewed with the patient and are negative.   MEDICAL HISTORY:  Past Medical History:  Diagnosis Date   Arthritis    psoriatic   Cirrhosis (HCC)    Coronary artery disease    Diabetes mellitus without complication (HCC)  type 2   GERD (gastroesophageal reflux disease)    Low magnesium levels    Psoriasis     SURGICAL HISTORY: Past Surgical History:  Procedure Laterality Date   APPENDECTOMY     CHOLECYSTECTOMY     COLONOSCOPY WITH PROPOFOL N/A 12/27/2016   Procedure: COLONOSCOPY WITH PROPOFOL;  Surgeon: Charlott Rakes, MD;  Location: [facility];  Service: Endoscopy;  Laterality: N/A;   CORONARY ARTERY BYPASS GRAFT  2005   x 4   EYE SURGERY Bilateral    ioc for cataracts    liver tranplant   2013 at [facility]   PATELLA FRACTURE SURGERY Left    PROSTATE BIOPSY     surgery on broken leg Left 1991   multiple surgies with rod replaced and removed and redoved    SOCIAL HISTORY: Social History   Socioeconomic History   Marital status: Married    Spouse name: Not on file   Number of children: 5   Years of education: Not on file   Highest education level: Not on file  Occupational History   Not on file  Tobacco Use   Smoking status: Never   Smokeless tobacco: Never  Vaping Use   Vaping Use: Never used  Substance and Sexual Activity   Alcohol use: No   Drug use: No   Sexual activity: Not Currently  Other Topics Concern   Not on file  Social History Narrative   Not on file   Social Determinants of Health   Financial Resource Strain: Not on file  Food Insecurity: No Food Insecurity (02/19/2023)   Hunger Vital Sign    Worried About Running Out of Food in the Last Year: Never true    Ran Out of Food in the Last Year: Never true  Transportation Needs: No Transportation Needs (02/19/2023)   PRAPARE - Administrator, Civil Service (Medical): No    Lack of Transportation (Non-Medical): No  Physical Activity: Not on file  Stress: Not on file  Social Connections: Not on file  Intimate Partner Violence: Not At Risk (02/19/2023)   Humiliation, Afraid, Rape, and Kick questionnaire    Fear of Current or Ex-Partner: No    Emotionally Abused: No    Physically Abused: No    Sexually Abused: No    FAMILY HISTORY: History reviewed. No pertinent family history.  ALLERGIES:  is allergic to methotrexate.  MEDICATIONS:  Current Outpatient Medications  Medication Sig Dispense Refill   abiraterone acetate (ZYTIGA) 250 MG tablet Take 4 tablets (1,000 mg total) by mouth daily. Take on an empty stomach 1 hour before or 2 hours after a meal 120 tablet 2   predniSONE (DELTASONE) 5 MG tablet Take 1 tablet (5 mg total) by mouth daily with breakfast. 90 tablet  1   relugolix (ORGOVYX) 120 MG tablet Take 1 tablet (120 mg total) by mouth daily. 30 tablet 3   aspirin EC 81 MG tablet Take 81 mg by mouth daily.     atorvastatin (LIPITOR) 10 MG tablet TAKE 1 TABLET BY MOUTH EVERY DAY IN THE EVENING 90 tablet 0   betamethasone dipropionate 0.05 % cream Apply topically as needed.     Calcium Carbonate-Vitamin D (CALCIUM + D PO) Take 1 tablet by mouth 2 (two) times daily.      icosapent Ethyl (VASCEPA) 1 g capsule TAKE 2 CAPSULES BY MOUTH 2 TIMES DAILY. 360 capsule 3   Insulin Glargine (BASAGLAR KWIKPEN [place]) Inject 1 mL into the skin.  LANTUS SOLOSTAR 100 UNIT/ML Solostar Pen SMARTSIG:34 Unit(s) SUB-Q Daily     Magnesium 300 MG CAPS Take 1 tablet by mouth daily.     metFORMIN (GLUCOPHAGE) 1000 MG tablet Take 1,000 mg by mouth 2 (two) times daily.     niacinamide 500 MG tablet niacinamide     omeprazole (PRILOSEC) 20 MG capsule Take 20 mg by mouth daily.     ONETOUCH ULTRA test strip USE TO TEST TWICE DAILY     sildenafil (VIAGRA) 100 MG tablet SMARTSIG:0.5-1 Tablet(s) By Mouth PRN     tacrolimus (PROGRAF) 0.5 MG capsule Take by mouth.     No current facility-administered medications for this visit.    PHYSICAL EXAMINATION: ECOG PERFORMANCE STATUS: 1 - Symptomatic but completely ambulatory  Vitals:   03/07/23 1520  BP: 126/88  Pulse: 81  Resp: 16  Temp: 97.7 F (36.5 C)  SpO2: 98%   Filed Weights   03/07/23 1520  Weight: 178 lb (80.7 kg)     GENERAL:alert, no distress and comfortable SKIN: skin color normal, no rashes or significant lesions EYES: normal, Conjunctiva are pink and non-injected, sclera clear  NEURO: alert & oriented x 3 with fluent speech NECK: (-) supple, thyroid normal size, non-tender, without nodularity LYMPH:(-)  no palpable lymphadenopathy in the cervical, axillary  LUNGS:(-) clear to auscultation and percussion with normal breathing effort HEART: (-) regular rate & rhythm and no murmurs and(-) no lower extremity  edema ABDOMEN:(-)abdomen soft,(-)  non-tender and(-)  normal bowel sounds   LABORATORY DATA:  I have reviewed the data as listed    Latest Ref Rng & Units 03/07/2023    4:02 PM 05/06/2022    9:03 AM 02/22/2012    7:02 PM  CBC  WBC 4.0 - 10.5 K/uL 8.4  10.5  6.6   Hemoglobin 13.0 - 17.0 g/dL 16.1  09.6  04.5   Hematocrit 39.0 - 52.0 % 45.2  52.3  37.0   Platelets 150 - 400 K/uL 209  197  47        Latest Ref Rng & Units 03/07/2023    4:02 PM 05/06/2022    9:03 AM 02/22/2012    7:02 PM  CMP  Glucose 70 - 99 mg/dL 409  811  914   BUN 8 - 23 mg/dL 21  17  17    Creatinine 0.61 - 1.24 mg/dL 7.82  9.56  2.13   Sodium 135 - 145 mmol/L 135  136  128   Potassium 3.5 - 5.1 mmol/L 4.8  4.6  4.7   Chloride 98 - 111 mmol/L 102  99  98   CO2 22 - 32 mmol/L 27  25  19    Calcium 8.9 - 10.3 mg/dL 9.8  08.6  8.8   Total Protein 6.5 - 8.1 g/dL 7.8  8.9  7.0   Total Bilirubin 0.3 - 1.2 mg/dL 0.5  1.4  5.9   Alkaline Phos 38 - 126 U/L 99  60  134   AST 15 - 41 U/L 31  22  65   ALT 0 - 44 U/L 65  24  34      RADIOGRAPHIC STUDIES: I have personally reviewed the radiological images as listed and agreed with the findings in the report. No results found.   Orders Placed This Encounter  Procedures   CBC with Differential/Platelet    Standing Status:   Standing    Number of Occurrences:   50    Standing Expiration Date:  03/06/2024   Comprehensive metabolic panel    Standing Status:   Standing    Number of Occurrences:   50    Standing Expiration Date:   03/06/2024   Prostate-Specific AG, Serum    Standing Status:   Standing    Number of Occurrences:   20    Standing Expiration Date:   03/06/2024    All questions were answered. The patient knows to call the clinic with any problems, questions or concerns. The total time spent in the appointment was 60 minutes.     Malachy Mood, MD 03/07/2023 5:24 PM  I, Monica Martinez am acting as scribe for Malachy Mood, MD.   I have reviewed the above  documentation for accuracy and completeness, and I agree with the above.

## 2023-03-08 ENCOUNTER — Telehealth: Payer: Self-pay | Admitting: Pharmacy Technician

## 2023-03-08 ENCOUNTER — Telehealth: Payer: Self-pay | Admitting: Pharmacist

## 2023-03-08 ENCOUNTER — Other Ambulatory Visit (HOSPITAL_COMMUNITY): Payer: Self-pay

## 2023-03-08 NOTE — Telephone Encounter (Signed)
Oral Oncology Patient Advocate Encounter  After completing a benefits investigation, prior authorization for abiraterone is not required at this time through [facility] Medicare D.  Patient's copay is $1,196.59.     Jinger Neighbors, CPhT-Adv Oncology Pharmacy Patient Advocate [facility] Direct Number: [phone]  Fax: [phone]

## 2023-03-08 NOTE — Telephone Encounter (Signed)
Oral Oncology Pharmacist Encounter  Received new prescription for Zytiga (abiraterone) for the treatment of metastatic castration sensitive prostate cancer in conjunction with Orgovyx and prednisone, planned duration until disease progression or unacceptable drug toxicity.   CBC w/ Diff and CMP from 03/07/23 assessed, patient with Scr of 1.39 mg/dL (CrCL ~78.2 mL/min) - no renal dose adjustments required for Zytiga (or Orgovyx). ALT slightly elevated at 65 U/L, but ALT and T.bili WNL. Prescription dose and frequency assessed for both Zytiga and Orgovyx appropriateness. Per MD note, patient has already had loading dose of Orgovyx last month.   Current medication list in Epic reviewed, DDIs with  Zytiga identified: Category C drug-drug interaction between Zytiga and Atorvastatin - Zytiga may increase risk of myalgia type side effects from Atorvastatin via OATP1B1 inhibition - no changes in therapy warranted at this time, recommend monitoring for increased side effects from atorvastatin.  No relevant/significant DDIs with Orgovyx identified.  Evaluated chart and no patient barriers to medication adherence noted.   Patient agreement for treatment documented in MD note on 03/08/23.  Prescription has been e-scribed to the [facility] for benefits analysis and approval.  Oral Oncology Clinic will continue to follow for insurance authorization, copayment issues, initial counseling and start date.  Lenord Carbo, PharmD, BCPS, BCOP Hematology/Oncology Clinical Pharmacist [facility] and [facility] Oral Chemotherapy Navigation Clinics [phone] 03/08/2023 8:11 AM

## 2023-03-09 LAB — PROSTATE-SPECIFIC AG, SERUM (LABCORP): Prostate Specific Ag, Serum: 3.7 ng/mL (ref 0.0–4.0)

## 2023-03-11 ENCOUNTER — Other Ambulatory Visit: Payer: Self-pay

## 2023-03-11 ENCOUNTER — Telehealth: Payer: Self-pay | Admitting: Pharmacy Technician

## 2023-03-11 ENCOUNTER — Other Ambulatory Visit (HOSPITAL_COMMUNITY): Payer: Self-pay

## 2023-03-11 NOTE — Telephone Encounter (Signed)
Oral Chemotherapy Pharmacist Encounter  I spoke with patient for overview of: Zytiga (abiraterone acetate) for the treatment of metastatic, castration-sensitive prostate cancer in conjunction with prednisone and Orgovyx, planned duration until disease progression or unacceptable toxicity.   Counseled patient on administration, dosing, side effects, monitoring, drug-food interactions, safe handling, storage, and disposal.  Patient will take Zytiga 250mg  tablets, 4 tablets (1000mg ) by mouth once daily on an empty stomach, 1 hour before or 2 hours after a meal.  Patient will take prednisone 5mg  tablet, 1 tablet by mouth one daily with breakfast.  Patient will take Orgovyx 120 mg tablet, 1 tablet (120 mg total) by mouth daily. Patient has already had loading dose last month.  Zytiga start date: 03/13/23  Adverse effects include but are not limited to: peripheral edema, GI upset, hypertension, hot flashes, fatigue, and arthralgias.    Reviewed with patient importance of keeping a medication schedule and plan for any missed doses. No barriers to medication adherence identified.  Medication reconciliation performed and medication/allergy list updated.  Insurance authorization for Roosvelt Maser has been obtained. Patient will pick up from the [facility] outpatient pharmacy on 03/12/23 PM.  Patient informed the pharmacy will reach out 5-7 days prior to needing next fill of Zytiga to coordinate continued medication acquisition to prevent break in therapy.  All questions answered.  Mr. Upchurch voiced understanding and appreciation.   Medication education handout placed in mail for patient. Patient knows to call the office with questions or concerns. Oral Chemotherapy Clinic phone number provided to patient.   Lenord Carbo, PharmD, BCPS, [facility] Hematology/Oncology Clinical Pharmacist [facility] Oral Chemotherapy Navigation Clinics [phone] 03/11/2023 9:13 AM

## 2023-03-11 NOTE — Telephone Encounter (Signed)
Oral Oncology Patient Advocate Encounter   Was successful in obtaining a copay card for abiraterone (APOTEX brand).  This copay card will make the patients copay $5.  I have spoken with the patient.    The billing information is as follows and has been shared with [facility].   RxBin: [id number] Member ID: [id number] Group ID: [id number]   Jinger Neighbors, CPhT-Adv Oncology Pharmacy Patient Advocate [facility] Direct Number: [phone]  Fax: [phone]

## 2023-03-12 ENCOUNTER — Other Ambulatory Visit: Payer: Self-pay | Admitting: Pharmacist

## 2023-03-12 ENCOUNTER — Other Ambulatory Visit (HOSPITAL_COMMUNITY): Payer: Self-pay

## 2023-03-12 ENCOUNTER — Other Ambulatory Visit: Payer: Self-pay

## 2023-03-12 NOTE — Progress Notes (Signed)
RN spoke with patient and his wife to review any questions, concerns, or barriers since recent Med Onc consult.   Patient plans to pick up Zytiga and prednisone today with anticipation of starting on 6/12.  Patient is aware of ADT appointment with [facility] on 6/14.  MD notified that patient will be transitioning to [facility]. Pharmacy notified as well.   Will continue to follow, plan of care in progress.

## 2023-03-18 ENCOUNTER — Encounter (HOSPITAL_BASED_OUTPATIENT_CLINIC_OR_DEPARTMENT_OTHER): Payer: Self-pay | Admitting: Urology

## 2023-03-22 ENCOUNTER — Other Ambulatory Visit: Payer: Self-pay

## 2023-03-22 ENCOUNTER — Encounter (HOSPITAL_BASED_OUTPATIENT_CLINIC_OR_DEPARTMENT_OTHER): Payer: Self-pay | Admitting: Urology

## 2023-03-22 NOTE — Progress Notes (Addendum)
Spoke w/ via phone for pre-op interview---pt Lab needs dos---- I stat              Lab results------see below COVID test -----patient states asymptomatic no test needed Arrive at -------1000 NPO after MN NO Solid Food.  Clear liquids from MN until---900 Med rec completed Medications to take morning of surgery :Prograf, Omeprazole, Abiraterone Take 1/2 dose of pm Lantus night before surgery (take 17 units) Patient instructed to bring photo id and insurance card day of surgery Patient aware to have Driver (ride ) / caregiver    wife jean for 24 hours after surgery  Patient Special Instructions -----fleets enema night before surgery Pre-Op special Instructions -----none Patient verbalized understanding of instructions that were given at this phone interview. Patient denies shortness of breath, chest pain, fever, cough at this phone interview.  Anesthesia Review: CAD, DM TYPE 2, CIRRHOSIS S/P LIVER TRANSPLANT (2013)  Reviewed patient history with dr Maisie Fus brock, mda, pt needs cardiac clearance for 04-09-2023 surgery at [facility] per dr Maisie Fus brock mda. Called and left telephone message with selita at dr Benancio Deeds office, pt needs cardiac clearance  for 04-09-2023 surgery per dr Leslye Peer mda  PCP: DR Jeannetta Nap Cardiologist :DR Jacinto Halim, Theron Arista Nori Riis 07-25-2022 epic :EKG :08-20-2022 Chest ct angio 05-06-2022 epic Echo :08-20-2022 epic Stress test:08-20-2022 epic Cardiac Cath : none Activity level: does own housework and hard work, can climb flight of staits without sob. Lov liver transplant 12-20-2022 dr Jill Side baron-johnson epic Sleep Study/ CPAP :none Fasting Blood Sugar :      / Checks Blood Sugar -- times a day:   Did not receive 81 mg asa instructions, last dose to be day before surgery 04-08-2023

## 2023-03-26 ENCOUNTER — Encounter: Payer: Self-pay | Admitting: Cardiology

## 2023-03-27 ENCOUNTER — Other Ambulatory Visit: Payer: Self-pay

## 2023-03-27 ENCOUNTER — Other Ambulatory Visit (HOSPITAL_COMMUNITY): Payer: Self-pay

## 2023-03-29 ENCOUNTER — Other Ambulatory Visit: Payer: Self-pay

## 2023-04-01 ENCOUNTER — Other Ambulatory Visit: Payer: Self-pay

## 2023-04-01 NOTE — Progress Notes (Signed)
RN left voicemail for call back regarding any questions or concerns with treatment.

## 2023-04-08 ENCOUNTER — Other Ambulatory Visit (HOSPITAL_COMMUNITY): Payer: Self-pay

## 2023-04-08 ENCOUNTER — Other Ambulatory Visit: Payer: Self-pay

## 2023-04-09 ENCOUNTER — Other Ambulatory Visit (HOSPITAL_COMMUNITY): Payer: Self-pay

## 2023-04-09 ENCOUNTER — Encounter (HOSPITAL_BASED_OUTPATIENT_CLINIC_OR_DEPARTMENT_OTHER): Payer: Self-pay | Admitting: Urology

## 2023-04-09 ENCOUNTER — Telehealth: Payer: Self-pay | Admitting: Radiation Oncology

## 2023-04-09 ENCOUNTER — Other Ambulatory Visit: Payer: Self-pay

## 2023-04-09 ENCOUNTER — Encounter (HOSPITAL_BASED_OUTPATIENT_CLINIC_OR_DEPARTMENT_OTHER)
Admission: RE | Disposition: A | Discharge status: Home / Self Care | Payer: Self-pay | Source: Home / Self Care | Attending: Urology

## 2023-04-09 ENCOUNTER — Ambulatory Visit (HOSPITAL_BASED_OUTPATIENT_CLINIC_OR_DEPARTMENT_OTHER)
Admission: RE | Admit: 2023-04-09 | Discharge: 2023-04-09 | Disposition: A | Discharge status: Home / Self Care | Payer: Medicare HMO | Attending: Urology | Admitting: Urology

## 2023-04-09 ENCOUNTER — Ambulatory Visit (HOSPITAL_BASED_OUTPATIENT_CLINIC_OR_DEPARTMENT_OTHER): Payer: Medicare HMO | Admitting: Anesthesiology

## 2023-04-09 DIAGNOSIS — E119 Type 2 diabetes mellitus without complications: Secondary | ICD-10-CM

## 2023-04-09 DIAGNOSIS — Z794 Long term (current) use of insulin: Secondary | ICD-10-CM | POA: Diagnosis not present

## 2023-04-09 DIAGNOSIS — I2510 Atherosclerotic heart disease of native coronary artery without angina pectoris: Secondary | ICD-10-CM | POA: Diagnosis not present

## 2023-04-09 DIAGNOSIS — C61 Malignant neoplasm of prostate: Secondary | ICD-10-CM

## 2023-04-09 DIAGNOSIS — K219 Gastro-esophageal reflux disease without esophagitis: Secondary | ICD-10-CM | POA: Diagnosis not present

## 2023-04-09 DIAGNOSIS — Z951 Presence of aortocoronary bypass graft: Secondary | ICD-10-CM | POA: Diagnosis not present

## 2023-04-09 DIAGNOSIS — Z944 Liver transplant status: Secondary | ICD-10-CM | POA: Diagnosis not present

## 2023-04-09 HISTORY — PX: SPACE OAR INSTILLATION: SHX6769

## 2023-04-09 HISTORY — PX: GOLD SEED IMPLANT: SHX6343

## 2023-04-09 HISTORY — DX: Malignant neoplasm of prostate: C61

## 2023-04-09 LAB — POCT I-STAT, CHEM 8
BUN: 19 mg/dL (ref 8–23)
Calcium, Ion: 1.26 mmol/L (ref 1.15–1.40)
Chloride: 101 mmol/L (ref 98–111)
Creatinine, Ser: 1.1 mg/dL (ref 0.61–1.24)
Glucose, Bld: 74 mg/dL (ref 70–99)
HCT: 47 % (ref 39.0–52.0)
Hemoglobin: 16 g/dL (ref 13.0–17.0)
Potassium: 3.8 mmol/L (ref 3.5–5.1)
Sodium: 137 mmol/L (ref 135–145)
TCO2: 26 mmol/L (ref 22–32)

## 2023-04-09 LAB — GLUCOSE, CAPILLARY: Glucose-Capillary: 100 mg/dL — ABNORMAL HIGH (ref 70–99)

## 2023-04-09 SURGERY — INSERTION, GOLD SEEDS
Anesthesia: Monitor Anesthesia Care | Site: Prostate

## 2023-04-09 MED ORDER — LIDOCAINE 2% (20 MG/ML) 5 ML SYRINGE
INTRAMUSCULAR | Status: DC | PRN
  Administered 2023-04-09: 40 mg via INTRAVENOUS

## 2023-04-09 MED ORDER — AMISULPRIDE (ANTIEMETIC) 5 MG/2ML IV SOLN: 5.0000 mg | Freq: Once | INTRAVENOUS | Status: DC | PRN

## 2023-04-09 MED ORDER — FLEET ENEMA 7-19 GM/118ML RE ENEM: 1.0000 | ENEMA | Freq: Once | RECTAL | Status: DC

## 2023-04-09 MED ORDER — SODIUM CHLORIDE (PF) 0.9 % IJ SOLN
INTRAMUSCULAR | Status: DC | PRN
  Administered 2023-04-09: 10 mL

## 2023-04-09 MED ORDER — PROPOFOL 500 MG/50ML IV EMUL
INTRAVENOUS | Status: AC
  Filled 2023-04-09: qty 50

## 2023-04-09 MED ORDER — CIPROFLOXACIN IN D5W 400 MG/200ML IV SOLN
INTRAVENOUS | Status: AC
  Filled 2023-04-09: qty 200

## 2023-04-09 MED ORDER — CIPROFLOXACIN IN D5W 400 MG/200ML IV SOLN
400.0000 mg | INTRAVENOUS | Status: AC
  Administered 2023-04-09: 400 mg via INTRAVENOUS

## 2023-04-09 MED ORDER — PROPOFOL 500 MG/50ML IV EMUL
INTRAVENOUS | Status: DC | PRN
  Administered 2023-04-09: 200 ug/kg/min via INTRAVENOUS

## 2023-04-09 MED ORDER — LACTATED RINGERS IV SOLN: INTRAVENOUS | Status: DC

## 2023-04-09 MED ORDER — LIDOCAINE HCL (PF) 2 % IJ SOLN
INTRAMUSCULAR | Status: AC
  Filled 2023-04-09: qty 20

## 2023-04-09 MED ORDER — PHENYLEPHRINE 80 MCG/ML (10ML) SYRINGE FOR IV PUSH (FOR BLOOD PRESSURE SUPPORT)
PREFILLED_SYRINGE | INTRAVENOUS | Status: AC
  Filled 2023-04-09: qty 10

## 2023-04-09 MED ORDER — LIDOCAINE HCL 1 % IJ SOLN
INTRAMUSCULAR | Status: DC | PRN
  Administered 2023-04-09: 8 mL

## 2023-04-09 MED ORDER — PROPOFOL 10 MG/ML IV BOLUS
INTRAVENOUS | Status: DC | PRN
  Administered 2023-04-09 (×2): 20 mg via INTRAVENOUS

## 2023-04-09 MED ORDER — PROPOFOL 1000 MG/100ML IV EMUL
INTRAVENOUS | Status: AC
  Filled 2023-04-09: qty 200

## 2023-04-09 MED ORDER — FENTANYL CITRATE (PF) 100 MCG/2ML IJ SOLN: 25.0000 ug | INTRAMUSCULAR | Status: DC | PRN

## 2023-04-09 SURGICAL SUPPLY — 26 items
BLADE CLIPPER SENSICLIP SURGIC (BLADE) ×1 IMPLANT
CNTNR URN SCR LID CUP LEK RST (MISCELLANEOUS) ×1 IMPLANT
CONT SPEC 4OZ STRL OR WHT (MISCELLANEOUS) ×1
COVER BACK TABLE 60X90IN (DRAPES) ×1 IMPLANT
DRSG TEGADERM 4X4.75 (GAUZE/BANDAGES/DRESSINGS) ×1 IMPLANT
DRSG TEGADERM 8X12 (GAUZE/BANDAGES/DRESSINGS) ×1 IMPLANT
GAUZE SPONGE 4X4 12PLY STRL (GAUZE/BANDAGES/DRESSINGS) ×1 IMPLANT
GLOVE BIO SURGEON STRL SZ7.5 (GLOVE) ×1 IMPLANT
GLOVE ECLIPSE 8.0 STRL XLNG CF (GLOVE) ×1 IMPLANT
GLOVE SURG ORTHO 8.5 STRL (GLOVE) ×1 IMPLANT
IMPL SPACEOAR VUE SYSTEM (Spacer) ×1 IMPLANT
IMPLANT SPACEOAR VUE SYSTEM (Spacer) ×1 IMPLANT
KIT TURNOVER CYSTO (KITS) ×1 IMPLANT
MARKER GOLD PRELOAD 1.2X3 (Urological Implant) ×1 IMPLANT
MARKER SKIN DUAL TIP RULER LAB (MISCELLANEOUS) ×1 IMPLANT
NDL SPNL 22GX3.5 QUINCKE BK (NEEDLE) ×1 IMPLANT
NEEDLE SPNL 22GX3.5 QUINCKE BK (NEEDLE) ×1 IMPLANT
SEED GOLD PRELOAD 1.2X3 (Urological Implant) ×1 IMPLANT
SHEATH ULTRASOUND LF (SHEATH) IMPLANT
SHEATH ULTRASOUND LTX NONSTRL (SHEATH) IMPLANT
SLEEVE SCD COMPRESS KNEE MED (STOCKING) ×1 IMPLANT
SURGILUBE 2OZ TUBE FLIPTOP (MISCELLANEOUS) ×1 IMPLANT
SYR 10ML LL (SYRINGE) IMPLANT
SYR CONTROL 10ML LL (SYRINGE) ×1 IMPLANT
TOWEL OR 17X24 6PK STRL BLUE (TOWEL DISPOSABLE) ×1 IMPLANT
UNDERPAD 30X36 HEAVY ABSORB (UNDERPADS AND DIAPERS) ×1 IMPLANT

## 2023-04-09 NOTE — Discharge Instructions (Signed)

## 2023-04-09 NOTE — Telephone Encounter (Signed)
7/9 @ 3:28 pm patient and wife called to schedule his next appt with Dr. Kathrynn Running after he had his marks put in for radiation treatment.  Spoke to Sam ([employer]).  Patient is aware of CT Sim/Treatment planning appt on 7/26.

## 2023-04-09 NOTE — Op Note (Signed)
Preoperative diagnosis: Adenocarcinoma of the prostate  Postoperative diagnosis: Adenocarcinoma of the prostate  Procedure: 1) Placement of fiducial markers into prostate                    2) Insertion of SpaceOAR hydrogel   Surgeon: Belva Agee, MD  Radiation oncologist: Kathrynn Running  Anesthesia: General  EBL: Minimal  Complications: None  Indication: [name] is a 77 y.o. gentleman with adenocarcinoma of the prostate after discussing management options for treatment, he elected to proceed with radiotherapy. He presents today for the above procedures. The potential risks, complications, alternative options, and expected recovery course have been discussed in detail with the patient and he has provided informed consent to proceed.  Description of procedure: The patient was administered preoperative antibiotics, placed in the dorsal lithotomy position, and prepped and draped in the usual sterile fashion. Next, transrectal ultrasonography was utilized to visualize the prostate. Three gold fiducial markers were then placed into the prostate via transperineal needles under ultrasound guidance at the left apex, left base, and right mid gland under direct ultrasound guidance. A site in the midline was then selected on the perineum for placement of an 18 g needle with saline. The needle was advanced above the rectum and below Denonvillier's fascia to the mid gland and confirmed to be in the midline on transverse imaging. One cc of saline was injected confirming appropriate expansion of this space. A total of 5 cc of saline was then injected to open the space further bilaterally. The saline syringe was then removed and the SpaceOAR hydrogel was injected with good distribution bilaterally. He tolerated the procedure well and without complications. He was given a voiding trial prior to discharge from the PACU.  Belva Agee, MD

## 2023-04-09 NOTE — Transfer of Care (Signed)
Immediate Anesthesia Transfer of Care Note  Patient: [name]  Procedure(s) Performed: GOLD SEED IMPLANT (Prostate) SPACE OAR INSTILLATION (Prostate)  Patient Location: PACU  Anesthesia Type:MAC  Level of Consciousness: sedated  Airway & Oxygen Therapy: Patient Spontanous Breathing and Patient connected to face mask oxygen  Post-op Assessment: Report given to RN and Post -op Vital signs reviewed and stable  Post vital signs: Reviewed and stable  Last Vitals:  Vitals Value Taken Time  BP 91/61 04/09/23 1205  Temp    Pulse 72 04/09/23 1206  Resp 17 04/09/23 1206  SpO2 97 % 04/09/23 1206  Vitals shown include unvalidated device data.  Last Pain:  Vitals:   04/09/23 1023  TempSrc: Oral  PainSc: 0-No pain      Patients Stated Pain Goal: 3 (04/09/23 1023)  Complications: No notable events documented.

## 2023-04-09 NOTE — Interval H&P Note (Signed)
History and Physical Interval Note:  04/09/2023 10:44 AM  [name]  has presented today for surgery, with the diagnosis of PROSTATE CANCER.  The various methods of treatment have been discussed with the patient and family. After consideration of risks, benefits and other options for treatment, the patient has consented to  Procedure(s): GOLD SEED IMPLANT (N/A) SPACE OAR INSTILLATION (N/A) as a surgical intervention.  The patient's history has been reviewed, patient examined, no change in status, stable for surgery.  I have reviewed the patient's chart and labs.  Questions were answered to the patient's satisfaction.     Belva Agee

## 2023-04-09 NOTE — H&P (Signed)
Patient is a 77 year old white male seen today for evaluation of elevated PSA noted on recent physical exam. No prior PSA values to compare. His PSA was noted to be 27 on 07/28/2022. 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 Takes no blood thinning medications. There is no family history of prostate cancer. He does have some mild to moderate bladder outlet symptoms with decreased force of stream and nocturia 1-2 times at night.  Micro urinalysis today was clear on urine spun sediment. Postvoid residual was 55 cc  -12/31/22-patient with history of elevated PSA noted to be 27 on 07/28/2022. Has undergone recent transrectal ultrasound and prostate biopsy on 12/24/2022. This unfortunately shows adenocarcinoma the prostate in 11 out of the 12 cores high-grade disease with most of them showing Gleason 8 disease except for 2 of the core showing Gleason 4+3 equal 7 disease. See path below. Here to discuss next [employer].  TRUS/BX 12/24/22 PSA =27  PATH:  1. Left base lateral, prostate adenocarcinoma, Gleason score 4+4 = 8 involving 60% of 1 core  2. Left mid lateral, prostate adenocarcinoma, Gleason score 4+4 = 8 involving 43% of 1 core  3. Left apex lateral, prostate adenocarcinoma, Gleason score 4+4 = 8 involving 58% of 1 core  4. Left base prostate adenocarcinoma, Gleason score 4+4 equal 8 involving 86% of 1 core, perineural invasion present angiolymphatic invasion present  5. Left mid, prostate adenocarcinoma, Gleason score 4+4 = 4 involving 64% of 1 core  6. Right base, prostate adenocarcinoma, Gleason score 4+4 = 8 involving 64% of 1 core  7. Right mid, prostate adenocarcinoma, Gleason score 4+4 = 8 involving 31% of 1 core  8. Right apex prostate adenocarcinoma, Gleason score 4+4 = 8 involving 2 of 2 cores 35% and 32% respectively  9. Right base lateral, prostate adenocarcinoma, Gleason score 4+3 equal 7 involving 42% of 1 core with perineural  invasion present  10. Right mid lateral, prostate adenocarcinoma, Gleason score 4+3 equal 7 involving 34% of 1 core perineural invasion present11 right apex lateral, prostate adenocarcinoma, 11.Gleason score 4+4 = 8 involving 2 of 2 cores 33% 50% respectively  NCCN: High Risk  -02/08/23-patient recent diagnosis of high risk prostate cancer as above. Has had PET PSMA scan in the interim which showed intense radiotracer activity involving entirety of the prostate gland and some very small pulm pulmonary nodules which have intense radiotracer activities. The nodules were new from comparison CT. See report below.   CLINICAL DATA: Prostate cancer   EXAM:  NUCLEAR MEDICINE PET SKULL BASE TO THIGH   TECHNIQUE:  9.0 mCi F18 Piflufolastat (Pylarify) was injected intravenously.  Full-ring PET imaging was performed from the skull base to thigh  after the radiotracer. CT data was obtained and used for attenuation  correction and anatomic localization.   COMPARISON: CT chest abdomen pelvis 05/06/2022   FINDINGS:  NECK   No radiotracer activity in neck lymph nodes.   Incidental CT finding: None.   CHEST   Several very small pulmonary nodules with intense radiotracer  activity. These nodules are new from CT 1 year prior.   Example:   RIGHT upper lobe 4 mm nodule (image 33/7) with SUV max equal 5.5.   Peripheral RIGHT middle lobe nodule measuring 4 mm (48/7) with SUV  max 19.   Two nodules in the LEFT lobe are difficult define on the CT (SUV max  equal 5.2 on image 102).   There are approximately  5 radiotracer avid nodules in the RIGHT lung  and 3 in LEFT lung.   No radiotracer avid mediastinal or supraclavicular lymph nodes.   Incidental CT finding: Post CABG   ABDOMEN/PELVIS   Prostate: Intense radiotracer avid involving the entirety of the  prostate gland SUV max equal 56.   Lymph nodes: No abnormal radiotracer accumulation within pelvic or  abdominal nodes.   Liver: No  evidence of liver metastasis.   Incidental CT finding: Atherosclerotic calcification of the aorta.   SKELETON   No focal activity to suggest skeletal metastasis. No sclerotic or  lytic lesions on CT imaging. LEFT femur medullary nail fixation   IMPRESSION:  1. Intense radiotracer activity involving the entirety of the  prostate gland consistent with primary prostate adenocarcinoma.  2. There are bilateral very small nodules which have intense  radiotracer activity. These nodules are new from comparison CT.  While an atypical pattern, favor prostate cancer pulmonary  metastasis.  3. No pelvic lymphadenopathy, retroperitoneal lymphadenopathy or  thoracic metastatic adenopathy.  4. No liver metastasis or skeletal metastasis    Electronically Signed  By: Genevive Bi M.D.  On: 02/06/2023 16:59   -03/15/23 patient with history of recent diagnosis high risk prostate cancer with possible metastatic disease to the lung. Has been seen in multidisciplinary cancer clinic with the plan for continued androgen deprivation therapy plus addition of Zytiga 1000 mg daily plus prednisone. Also plan for 8 weeks of external beam radiation therapy to the prostate. Is scheduled for fiducial markers and SpaceOAR gel placement on 04/09/2023. Here today to convert to Eligard from Orgovyx.     ALLERGIES: Methotrexate    MEDICATIONS: Levofloxacin 750 mg tablet Take 1 tab po 1 hour prior to procedure  Omeprazole 20 mg tablet, delayed release  Abiraterone Acetate 250 mg tablet 1 tablet PO Daily  Aspirin Ec 81 mg tablet, delayed release  Atorvastatin Calcium 10 mg tablet 1 tablet PO Daily  Calcium  Lantus Solostar 100 unit/ml (3 ml) insulin pen 1 PO Daily  Magnesium 400 mg magnesium tablet  Metformin Hcl 1,000 mg tablet 1 tablet PO Daily  Myfortic 180 mg tablet, delayed release  Niacinamide 500 mg tablet  Orgovyx 120 mg tablet Take 3 tablets by mouth daily then 1 tablet daily thereafter  Prednisone 5 mg  tablet 1 tablet PO Daily  Sildenafil Citrate 100 mg tablet 1 tablet PO Daily  Tacrolimus 0.5 mg capsule 1 capsule PO Daily  Vascepa 1 gram capsule 1 capsule PO Daily     GU PSH: Prostate Needle Biopsy - 12/24/2022     NON-GU PSH: Anesth, Radical Femur Surg, Left Appendectomy (laparoscopic) CABG (coronary artery bypass grafting) Cholecystectomy (laparoscopic) Surgical Pathology, Gross And Microscopic Examination For Prostate Needle - 12/24/2022 Transplant Liver     GU PMH: Prostate Cancer - 02/08/2023, - 12/31/2022 Elevated PSA - 12/24/2022, - 11/08/2022 BPH w/LUTS - 11/08/2022 Weak Urinary Stream - 11/08/2022    NON-GU PMH: Arthritis Diabetes Type 2 Liver Disease Psoriatic arthritis mutilans    FAMILY HISTORY: Heart Attack - Brother   SOCIAL HISTORY: Marital Status: Married Ethnicity: Not Hispanic Or Latino; Race: White Current Smoking Status: Patient has never smoked.   Tobacco Use Assessment Completed: Used Tobacco in last 30 days? Does not use smokeless tobacco. Has never drank.  Does not use drugs. Drinks 2 caffeinated drinks per day.    REVIEW OF SYSTEMS:    GU Review Male:   Patient denies frequent urination, hard to postpone urination, burning/ pain with urination,  get up at night to urinate, leakage of urine, stream starts and stops, trouble starting your stream, have to strain to urinate , erection problems, and penile pain.  Gastrointestinal (Upper):   Patient denies nausea, vomiting, and indigestion/ heartburn.  Gastrointestinal (Lower):   Patient denies diarrhea and constipation.  Constitutional:   Patient denies fever, night sweats, weight loss, and fatigue.  Skin:   Patient denies skin rash/ lesion and itching.  Eyes:   Patient denies blurred vision and double vision.  Ears/ Nose/ Throat:   Patient denies sore throat and sinus problems.  Hematologic/Lymphatic:   Patient denies swollen glands and easy bruising.  Cardiovascular:   Patient denies leg swelling and  chest pains.  Respiratory:   Patient denies cough and shortness of breath.  Endocrine:   Patient denies excessive thirst.  Musculoskeletal:   Patient denies back pain and joint pain.  Neurological:   Patient denies headaches and dizziness.  Psychologic:   Patient denies depression and anxiety.   VITAL SIGNS: None   MULTI-SYSTEM PHYSICAL EXAMINATION:    Constitutional: Well-nourished. No physical deformities. Normally developed. Good grooming.  Neck: Neck symmetrical, not swollen. Normal tracheal position.  Respiratory: No labored breathing, no use of accessory muscles.   Cardiovascular: Normal temperature, normal extremity pulses, no swelling, no varicosities.  Lymphatic: No enlargement of neck, axillae, groin.  Skin: No paleness, no jaundice, no cyanosis. No lesion, no ulcer, no rash.  Neurologic / Psychiatric: Oriented to time, oriented to place, oriented to person. No depression, no anxiety, no agitation.  Eyes: Normal conjunctivae. Normal eyelids.  Ears, Nose, Mouth, and Throat: Left ear no scars, no lesions, no masses. Right ear no scars, no lesions, no masses. Nose no scars, no lesions, no masses. Normal hearing. Normal lips.  Musculoskeletal: Normal gait and station of head and neck.     Complexity of Data:  Source Of History:  Patient  Records Review:   Previous Doctor Records, Previous Hospital Records, Previous Patient Records  Urine Test Review:   Urinalysis   11/08/22  PSA  Total PSA 26.70 ng/mL    PROCEDURES:          Urinalysis - 81003 Dipstick Dipstick Cont'd  Color: Yellow Bilirubin: Neg mg/dL  Appearance: Clear Ketones: Neg mg/dL  Specific Gravity: 4.782 Blood: Neg ery/uL  pH: 6.5 Protein: Neg mg/dL  Glucose: Neg mg/dL Urobilinogen: 0.2 mg/dL    Nitrites: Neg    Leukocyte Esterase: Neg leu/uL         Eligard 45mg / 6 Month - X9217, 95621 The injection site was sterilely prepped with alcohol. Eligard was injected subcutaneously ([place]) using standard  technique. The patient tolerated the procedure well. A band aid was applied. The site was dry when the patient left the exam room. The patient will return as scheduled.  After treatment was administered, patient was observed without any adverse reactions noted.   Zero wasted.    Qty: 45 Adm. By: Modena Nunnery  Unit: mg Lot No [id number]  Route: SQ Exp. Date 04/29/2024  Freq: None Mfgr.: tolmar  Site: RUQ of abdomen   ASSESSMENT:      ICD-10 Details  1 GU:   Prostate Cancer - C61 Acute, Complicated Injury   PLAN:           Document Letter(s):  Created for Patient: Clinical Summary         Notes:   Patient with newly diagnosed high-grade prostate cancer with likely metastatic disease to the lung. He is scheduled for fiduciary  marker placement and SpaceOAR gel implant on 04/09/2023 to be followed by IMRT. Will subsequently have patient follow-up in the [facility] clinic after finishing radiation therapy.

## 2023-04-09 NOTE — Anesthesia Preprocedure Evaluation (Signed)
Anesthesia Evaluation  Patient identified by MRN, date of birth, ID band Patient awake    Reviewed: Allergy & Precautions, NPO status , Patient's Chart, lab work & pertinent test results  Airway Mallampati: II  TM Distance: >3 FB Neck ROM: Full    Dental  (+) Dental Advisory Given, Teeth Intact   Pulmonary neg pulmonary ROS   Pulmonary exam normal breath sounds clear to auscultation       Cardiovascular + CAD and + CABG  Normal cardiovascular exam Rhythm:Regular Rate:Normal  Echocardiogram 08/20/2022:  Normal LV systolic function with visual EF 60-65%. Left ventricle cavity is normal in size. Normal left ventricular wall thickness. Normal global wall motion. Doppler evidence of grade I (impaired) diastolic dysfunction, normal LAP. Calculated EF 70%.  Left atrial cavity is mildly dilated.  Right ventricle cavity is mildly dilated. Normal right ventricular function.  Trileaflet aortic valve with no regurgitation. Mild aortic valve leaflet thickening.  Structurally normal mitral valve.  Mild (Grade I) mitral regurgitation.  E-wave dominant mitral inflow.  Structurally normal pulmonic valve.  Mild to moderate pulmonic regurgitation.  No significant change compared to prior.    Exercise nuclear stress test 08/20/2022: Myocardial perfusion is normal. Overall LV systolic function is normal without regional wall motion abnormalities. Calculated Stress LV EF: 41%. Visually appears normal.  Normal ECG stress. Markedly reduced exercise tolerance. Stress symptoms included dyspnea.  The blood pressure response was normal. The patient exercised for 1 minutes and 51 seconds of a Bruce protocol, achieving approximately 4.3 METs &  85% MPHR. Compared to 07/28/2010 report, anterior and lateral ischemia no longer present. Low risk.     Neuro/Psych negative neurological ROS     GI/Hepatic ,GERD  ,,Transplant   Endo/Other  diabetes     Renal/GU      Musculoskeletal  (+) Arthritis ,    Abdominal   Peds  Hematology   Anesthesia Other Findings   Reproductive/Obstetrics                             Anesthesia Physical Anesthesia Plan  ASA: 3  Anesthesia Plan: MAC   Post-op Pain Management: Minimal or no pain anticipated   Induction: Intravenous  PONV Risk Score and Plan: 2 and TIVA, Propofol infusion, Treatment may vary due to age or medical condition and Ondansetron  Airway Management Planned: Natural Airway  Additional Equipment:   Intra-op Plan:   Post-operative Plan:   Informed Consent: I have reviewed the patients History and Physical, chart, labs and discussed the procedure including the risks, benefits and alternatives for the proposed anesthesia with the patient or authorized representative who has indicated his/her understanding and acceptance.     Dental advisory given  Plan Discussed with: CRNA  Anesthesia Plan Comments:         Anesthesia Quick Evaluation

## 2023-04-10 ENCOUNTER — Encounter (HOSPITAL_BASED_OUTPATIENT_CLINIC_OR_DEPARTMENT_OTHER): Payer: Self-pay | Admitting: Urology

## 2023-04-10 ENCOUNTER — Other Ambulatory Visit (HOSPITAL_COMMUNITY): Payer: Self-pay

## 2023-04-10 NOTE — Anesthesia Postprocedure Evaluation (Signed)
Anesthesia Post Note  Patient: [name]  Procedure(s) Performed: GOLD SEED IMPLANT (Prostate) SPACE OAR INSTILLATION (Prostate)     Patient location during evaluation: PACU Anesthesia Type: MAC Level of consciousness: awake and alert Pain management: pain level controlled Vital Signs Assessment: post-procedure vital signs reviewed and stable Respiratory status: spontaneous breathing Cardiovascular status: stable Anesthetic complications: no   No notable events documented.  Last Vitals:  Vitals:   04/09/23 1245 04/09/23 1350  BP: (!) 148/87 (!) 166/89  Pulse: 63 60  Resp: 16 18  Temp:  (!) 36.4 C  SpO2: 100% 99%    Last Pain:  Vitals:   04/09/23 1350  TempSrc: Oral  PainSc: 0-No pain                 Lewie Loron

## 2023-04-17 ENCOUNTER — Telehealth: Payer: Self-pay | Admitting: Radiation Oncology

## 2023-04-17 NOTE — Telephone Encounter (Signed)
Received call from [employer]. Pt swallowed dental crown earlier today. They contacted [name] to make sure this would not affect anything with upcoming appts/treatments. I reached out to [facility] team to advise, they stated this will not affect upcoming SIM and treatments. They stated if there is any concern about internal damage, please contact pt's PCP. Information passed to dental office who verbalized understanding.

## 2023-04-18 ENCOUNTER — Encounter: Payer: Self-pay | Admitting: Hematology

## 2023-04-18 ENCOUNTER — Inpatient Hospital Stay (HOSPITAL_BASED_OUTPATIENT_CLINIC_OR_DEPARTMENT_OTHER): Payer: Medicare HMO | Admitting: Hematology

## 2023-04-18 ENCOUNTER — Other Ambulatory Visit: Payer: Self-pay

## 2023-04-18 ENCOUNTER — Other Ambulatory Visit (HOSPITAL_COMMUNITY): Payer: Self-pay

## 2023-04-18 ENCOUNTER — Inpatient Hospital Stay: Payer: Medicare HMO | Attending: Hematology

## 2023-04-18 VITALS — BP 161/96 | HR 67 | Temp 97.9°F | Resp 16 | Ht 67.0 in | Wt 181.9 lb

## 2023-04-18 DIAGNOSIS — C61 Malignant neoplasm of prostate: Secondary | ICD-10-CM

## 2023-04-18 DIAGNOSIS — C7800 Secondary malignant neoplasm of unspecified lung: Secondary | ICD-10-CM | POA: Diagnosis not present

## 2023-04-18 DIAGNOSIS — Z7952 Long term (current) use of systemic steroids: Secondary | ICD-10-CM | POA: Insufficient documentation

## 2023-04-18 DIAGNOSIS — K7460 Unspecified cirrhosis of liver: Secondary | ICD-10-CM | POA: Insufficient documentation

## 2023-04-18 DIAGNOSIS — Z794 Long term (current) use of insulin: Secondary | ICD-10-CM | POA: Diagnosis not present

## 2023-04-18 DIAGNOSIS — Z191 Hormone sensitive malignancy status: Secondary | ICD-10-CM | POA: Diagnosis not present

## 2023-04-18 DIAGNOSIS — K219 Gastro-esophageal reflux disease without esophagitis: Secondary | ICD-10-CM | POA: Insufficient documentation

## 2023-04-18 DIAGNOSIS — Z923 Personal history of irradiation: Secondary | ICD-10-CM | POA: Insufficient documentation

## 2023-04-18 DIAGNOSIS — Z944 Liver transplant status: Secondary | ICD-10-CM | POA: Insufficient documentation

## 2023-04-18 DIAGNOSIS — Z7982 Long term (current) use of aspirin: Secondary | ICD-10-CM | POA: Diagnosis not present

## 2023-04-18 DIAGNOSIS — Z79899 Other long term (current) drug therapy: Secondary | ICD-10-CM | POA: Diagnosis not present

## 2023-04-18 DIAGNOSIS — E1136 Type 2 diabetes mellitus with diabetic cataract: Secondary | ICD-10-CM | POA: Diagnosis not present

## 2023-04-18 DIAGNOSIS — I2510 Atherosclerotic heart disease of native coronary artery without angina pectoris: Secondary | ICD-10-CM | POA: Insufficient documentation

## 2023-04-18 DIAGNOSIS — Z7984 Long term (current) use of oral hypoglycemic drugs: Secondary | ICD-10-CM | POA: Diagnosis not present

## 2023-04-18 LAB — COMPREHENSIVE METABOLIC PANEL
ALT: 14 U/L (ref 0–44)
AST: 15 U/L (ref 15–41)
Albumin: 4.2 g/dL (ref 3.5–5.0)
Alkaline Phosphatase: 73 U/L (ref 38–126)
Anion gap: 7 (ref 5–15)
BUN: 19 mg/dL (ref 8–23)
CO2: 28 mmol/L (ref 22–32)
Calcium: 9.7 mg/dL (ref 8.9–10.3)
Chloride: 98 mmol/L (ref 98–111)
Creatinine, Ser: 1.21 mg/dL (ref 0.61–1.24)
GFR, Estimated: >60 mL/min (ref >60)
Glucose, Bld: 98 mg/dL (ref 70–99)
Potassium: 4.5 mmol/L (ref 3.5–5.1)
Sodium: 133 mmol/L — ABNORMAL LOW (ref 135–145)
Total Bilirubin: 0.9 mg/dL (ref 0.3–1.2)
Total Protein: 7.6 g/dL (ref 6.5–8.1)

## 2023-04-18 LAB — CBC WITH DIFFERENTIAL/PLATELET
Abs Immature Granulocytes: 0.04 10*3/uL (ref 0.00–0.07)
Basophils Absolute: 0 10*3/uL (ref 0.0–0.1)
Basophils Relative: 1 %
Eosinophils Absolute: 0.1 10*3/uL (ref 0.0–0.5)
Eosinophils Relative: 1 %
HCT: 42.6 % (ref 39.0–52.0)
Hemoglobin: 14.4 g/dL (ref 13.0–17.0)
Immature Granulocytes: 1 %
Lymphocytes Relative: 18 %
Lymphs Abs: 1.5 10*3/uL (ref 0.7–4.0)
MCH: 25.9 pg — ABNORMAL LOW (ref 26.0–34.0)
MCHC: 33.8 g/dL (ref 30.0–36.0)
MCV: 76.8 fL — ABNORMAL LOW (ref 80.0–100.0)
Monocytes Absolute: 0.3 10*3/uL (ref 0.1–1.0)
Monocytes Relative: 4 %
Neutro Abs: 6.8 10*3/uL (ref 1.7–7.7)
Neutrophils Relative %: 75 %
Platelets: 191 10*3/uL (ref 150–400)
RBC: 5.55 MIL/uL (ref 4.22–5.81)
RDW: 16.7 % — ABNORMAL HIGH (ref 11.5–15.5)
WBC: 8.8 10*3/uL (ref 4.0–10.5)
nRBC: 0 % (ref 0.0–0.2)

## 2023-04-18 NOTE — Progress Notes (Signed)
[facility]   [phone] [phone]   Clinic Follow up Note   Patient Care Team: [name], MD as PCP - General (Family Medicine) Cherlyn Cushing, RN as Oncology Nurse Navigator Malachy Mood, MD as Consulting Physician (Hematology and Oncology) Belva Agee, MD as Consulting Physician (Urology) Jannifer Hick, MD as Consulting Physician (Urology)  Date of Service:  04/18/2023  CHIEF COMPLAINT: f/u of Metastatic Prostate Cancer   CURRENT THERAPY:  Zytiga 1000 mg daily and prednisone 5 mg daily in addition to ADT  ASSESSMENT:  [name] is a 77 y.o. 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  He is asymptomatic, without significant urinary symptoms or other new symptoms. -I reviewed his prostate biopsy results, 11 out of 12 cores showed prostate adenocarcinoma, with Gleason score 4+4 = 8, this is a high risk disease. -I again personally reviewed his PSMA scan images with patient, his wife and daughter in detail, which showed significant hypermetabolic uptake in the entire prostate, and multiple hypermetabolic small lung nodules, consistent with pulmonary metastasis.  No bone, lymph nodes or liver metastasis, he has low volume metastasis.  -We discussed his incurable nature of prostate cancer, and a systemic treatment is his main therapy -He has seen urologist Dr. Benancio Deeds and started androgen deprivation with Orgovyx loading dose for first month, this has been swtiched to Eligard injection in early July 2024. -Patient has met radiation oncologist Dr. Kathrynn Running, given his low volume of metastatic disease, did definitive radiation with LT-ADT +/- multimodal androgen blockade, concurrent with 8 weeks of external radiation was offered and he will start in late July. -I started  him on abiraterone and prednisone June 2024, he is tolerating very well.  Patient and his wife had some confusion about his medication refill, I confirmed with pharmacy that he has picked up the refill and has been taking it -His PSA has dropped significantly to 3.7 on 03/07/2023. -Dr. Benancio Deeds has retired, and he is scheduled to followup with Dr. Cardell Peach, will check with Dr. Cardell Peach about his care coordination.    2.  Liver cirrhosis status post liver transplant at the [facility] -Will monitor his liver function when he is on cancer treatment -He will continue tacrolimus and follow-up with his transplant team at the [facility]   3. DM -f/u with PCP and continue meds      PLAN: -continue Zytiga and prednisone. -lab reviewed -PSA -pending -lab and f/u in 3 months -I messaged Dr. Cardell Peach about his care coordination   SUMMARY OF ONCOLOGIC HISTORY: Oncology History Overview Note   Cancer Staging  Malignant neoplasm of prostate [facility]) Staging form: Prostate, AJCC 8th Edition - Clinical stage from 12/24/2022: Stage IVB (cT2a, cN0, cM1c, PSA: 27, Grade Group: 4) - Signed by Marcello Fennel, PA-C on 02/18/2023 Histopathologic type: Adenocarcinoma, NOS Stage prefix: Initial diagnosis Prostate specific antigen (PSA) range: 20 or greater Gleason primary pattern: 4 Gleason secondary pattern: 4 Gleason score: 8 Histologic grading system: 5 grade system Number of biopsy cores examined: 12 Number of biopsy cores positive: 11 Location of positive needle core biopsies: Both sides     Malignant neoplasm of prostate (HCC)  12/24/2022 Cancer Staging   Staging form: Prostate, AJCC 8th Edition - Clinical stage from 12/24/2022: Stage IVB (cT2a, cN0, cM1c,  PSA: 27, Grade Group: 4) - Signed by Marcello Fennel, PA-C on 02/18/2023 Histopathologic type: Adenocarcinoma, NOS Stage prefix: Initial diagnosis Prostate specific antigen (PSA) range: 20 or greater Gleason primary pattern: 4 Gleason secondary pattern:  4 Gleason score: 8 Histologic grading system: 5 grade system Number of biopsy cores examined: 12 Number of biopsy cores positive: 11 Location of positive needle core biopsies: Both sides   02/04/2023 PET scan    IMPRESSION: 1. Intense radiotracer activity involving the entirety of the prostate gland consistent with primary prostate adenocarcinoma. 2. There are bilateral very small nodules which have intense radiotracer activity. These nodules are new from comparison CT. While an atypical pattern, favor prostate cancer pulmonary metastasis. 3. No pelvic lymphadenopathy, retroperitoneal lymphadenopathy or thoracic metastatic adenopathy. 4. No liver metastasis or skeletal metastasis     02/18/2023 Initial Diagnosis   Malignant neoplasm of prostate [facility])    Pathology Results   Prostate Adenocarcinoma Gleason Score 4+4=8  PSA: 26.7      INTERVAL HISTORY:  [name] is here for a follow up of Metastatic Prostate Cancer. He was last seen by me on 03/07/2023. He presents to the clinic accompanied by family. Pt denies having any issue with Zytiga, and prednisone but hot flashes.    All other systems were reviewed with the patient and are negative.  MEDICAL HISTORY:  Past Medical History:  Diagnosis Date   Arthritis    psoriatic   Cirrhosis (HCC)    Coronary artery disease    Diabetes mellitus without complication (HCC) Type 2    GERD (gastroesophageal reflux disease)    Low magnesium levels    Prostate cancer (HCC)    Psoriasis    syncopy 05/06/2022   worked up by cardiology 08-20-2022    SURGICAL HISTORY: Past Surgical History:  Procedure Laterality Date   APPENDECTOMY     CHOLECYSTECTOMY     COLONOSCOPY WITH PROPOFOL N/A 12/27/2016   Procedure: COLONOSCOPY WITH PROPOFOL;  Surgeon: Charlott Rakes, MD;  Location: [facility];  Service: Endoscopy;  Laterality: N/A;   CORONARY ARTERY BYPASS GRAFT  2005   x 4   EYE SURGERY Bilateral    ioc for cataracts   GOLD  SEED IMPLANT N/A 04/09/2023   Procedure: GOLD SEED IMPLANT;  Surgeon: Belva Agee, MD;  Location: [facility];  Service: Urology;  Laterality: N/A;   liver tranplant   2013   at [facility]   PATELLA FRACTURE SURGERY Left 1990   PROSTATE BIOPSY     skin cancer area reved from head and nose     SPACE OAR INSTILLATION N/A 04/09/2023   Procedure: SPACE OAR INSTILLATION;  Surgeon: Belva Agee, MD;  Location: [facility];  Service: Urology;  Laterality: N/A;   surgery on broken leg Left 1991   multiple surgies with rod replaced and removed and redoved   VASECTOMY      I have reviewed the social history and family history with the patient and they are unchanged from previous note.  ALLERGIES:  is allergic to methotrexate.  MEDICATIONS:  Current Outpatient Medications  Medication Sig Dispense Refill   abiraterone acetate (ZYTIGA) 250 MG tablet Take 4 tablets (1,000 mg total) by mouth daily. Take on an empty stomach 1 hour before or 2 hours after a meal 120 tablet 2   aspirin EC 81 MG tablet Take 81 mg by mouth daily.     atorvastatin (LIPITOR) 10 MG tablet TAKE 1 TABLET BY MOUTH EVERY DAY  IN THE EVENING 90 tablet 0   betamethasone dipropionate 0.05 % cream Apply topically as needed.     Calcium Carbonate-Vitamin D (CALCIUM + D PO) Take 1 tablet by mouth daily.     icosapent Ethyl (VASCEPA) 1 g capsule TAKE 2 CAPSULES BY MOUTH 2 TIMES DAILY. 360 capsule 3   Insulin Glargine (BASAGLAR KWIKPEN [place]) Inject 1 mL into the skin.     LANTUS SOLOSTAR 100 UNIT/ML Solostar Pen 34 Units every evening. 100 units q pm     Magnesium 300 MG CAPS Take 1 tablet by mouth 2 (two) times daily.     metFORMIN (GLUCOPHAGE) 1000 MG tablet Take 1,000 mg by mouth 2 (two) times daily.     niacinamide 500 MG tablet niacinamide     omeprazole (PRILOSEC) 20 MG capsule Take 20 mg by mouth daily.     ONETOUCH ULTRA test strip USE TO TEST TWICE DAILY     predniSONE (DELTASONE) 5 MG tablet Take  1 tablet (5 mg total) by mouth daily with breakfast. 90 tablet 1   sildenafil (VIAGRA) 100 MG tablet SMARTSIG:0.5-1 Tablet(s) By Mouth PRN     tacrolimus (PROGRAF) 0.5 MG capsule Take by mouth.     No current facility-administered medications for this visit.    PHYSICAL EXAMINATION: ECOG PERFORMANCE STATUS: 1 - Symptomatic but completely ambulatory  Vitals:   04/18/23 1156  BP: (!) 161/96  Pulse: 67  Resp: 16  Temp: 97.9 F (36.6 C)  SpO2: 99%   Wt Readings from Last 3 Encounters:  04/18/23 181 lb 14.4 oz (82.5 kg)  04/09/23 176 lb 8 oz (80.1 kg)  03/07/23 178 lb (80.7 kg)     GENERAL:alert, no distress and comfortable SKIN: skin color normal, no rashes or significant lesions EYES: normal, Conjunctiva are pink and non-injected, sclera clear  NEURO: alert & oriented x 3 with fluent speech  LABORATORY DATA:  I have reviewed the data as listed    Latest Ref Rng & Units 04/18/2023   10:21 AM 04/09/2023   10:41 AM 03/07/2023    4:02 PM  CBC  WBC 4.0 - 10.5 K/uL 8.8   8.4   Hemoglobin 13.0 - 17.0 g/dL 29.5  62.1  30.8   Hematocrit 39.0 - 52.0 % 42.6  47.0  45.2   Platelets 150 - 400 K/uL 191   209         Latest Ref Rng & Units 04/18/2023   10:21 AM 04/09/2023   10:41 AM 03/07/2023    4:02 PM  CMP  Glucose 70 - 99 mg/dL 98  74  657   BUN 8 - 23 mg/dL 19  19  21    Creatinine 0.61 - 1.24 mg/dL 8.46  9.62  9.52   Sodium 135 - 145 mmol/L 133  137  135   Potassium 3.5 - 5.1 mmol/L 4.5  3.8  4.8   Chloride 98 - 111 mmol/L 98  101  102   CO2 22 - 32 mmol/L 28   27   Calcium 8.9 - 10.3 mg/dL 9.7   9.8   Total Protein 6.5 - 8.1 g/dL 7.6   7.8   Total Bilirubin 0.3 - 1.2 mg/dL 0.9   0.5   Alkaline Phos 38 - 126 U/L 73   99   AST 15 - 41 U/L 15   31   ALT 0 - 44 U/L 14   65       RADIOGRAPHIC STUDIES: I have personally reviewed the  radiological images as listed and agreed with the findings in the report. No results found.    No orders of the defined types were placed in  this encounter.  All questions were answered. The patient knows to call the clinic with any problems, questions or concerns. No barriers to learning was detected. The total time spent in the appointment was 30 minutes.     Malachy Mood, MD 04/18/2023   Carolin Coy, CMA, am acting as scribe for Malachy Mood, MD.   I have reviewed the above documentation for accuracy and completeness, and I agree with the above.

## 2023-04-18 NOTE — Progress Notes (Signed)
Spoke w/receptionist for [facility] regarding obtaining Dr. Michel Bickers last office not for this pt and who's now covering this pt.  Receptionist stated that Dr. Jettie Pagan is now covering this pt but has not officially seen pt in clinic.  Requested if Dr. Michel Bickers las office not could be faxed to Dr. Latanya Maudlin office.  Received pt's last office note w/Dr. Benancio Deeds.  Fax given to Dr. Mosetta Putt for review.

## 2023-04-19 LAB — PROSTATE-SPECIFIC AG, SERUM (LABCORP): Prostate Specific Ag, Serum: 0.2 ng/mL (ref 0.0–4.0)

## 2023-04-22 NOTE — Progress Notes (Signed)
RN spoke with patient and patient's wife to review upcoming CT Simulation appointment and provided education on treatment steps.  All questions answered.  Plan of care in progress.

## 2023-04-25 ENCOUNTER — Telehealth: Payer: Self-pay | Admitting: *Deleted

## 2023-04-25 NOTE — Progress Notes (Signed)
  Radiation Oncology         [phone] ________________________________  Name: [name] MRN: [id number]  Date: 04/26/2023  DOB: [birth year]  SIMULATION AND TREATMENT PLANNING NOTE    ICD-10-CM   1. Malignant neoplasm of prostate (HCC)  C61       DIAGNOSIS:  77 y.o. gentleman with Stage IVB adenocarcinoma of the prostate with Gleason Score of 4+4 and PSA of 26.7 with low-volume metastatic disease involving the lungs   NARRATIVE:  The patient was brought to the CT Simulation planning suite.  Identity was confirmed.  All relevant records and images related to the planned course of therapy were reviewed.  The patient freely provided informed written consent to proceed with treatment after reviewing the details related to the planned course of therapy. The consent form was witnessed and verified by the simulation staff.  Then, the patient was set-up in a stable reproducible supine position for radiation therapy.  A vacuum lock pillow device was custom fabricated to position his legs in a reproducible immobilized position.  Then, I performed a urethrogram under sterile conditions to identify the prostatic apex.  CT images were obtained.  Surface markings were placed.  The CT images were loaded into the planning software.  Then the prostate target and avoidance structures including the rectum, bladder, bowel and hips were contoured.  Treatment planning then occurred.  The radiation prescription was entered and confirmed.  A total of one complex treatment devices was fabricated. I have requested : Intensity Modulated Radiotherapy (IMRT) is medically necessary for this case for the following reason:  Rectal sparing.Marland Kitchen  PLAN:   The prostate, seminal vesicles, and pelvic lymph nodes will initially be treated to 45 Gy in 25 fractions of 1.8 Gy followed by a boost to the prostate only, to 75 Gy with 15 additional fractions of 2.0 Gy concurrent with multimodal androgen blockade (started Orgovyx 02/08/23 and  added Zytiga/prednisone 03/13/23)  ________________________________  Artist Pais. Kathrynn Running, M.D.

## 2023-04-25 NOTE — Telephone Encounter (Signed)
CALLED PATIENT TO REMIND OF SIM APPT. FOR 04-26-23- ARRIVAL TIME- 12:45 PM @ [facility], INFORMED PATIENT TO ARRIVE WITH A FULL BLADDER, LVM FOR A RETURN CALL

## 2023-04-26 ENCOUNTER — Ambulatory Visit
Admission: RE | Admit: 2023-04-26 | Discharge: 2023-04-26 | Disposition: A | Discharge status: Home / Self Care | Payer: Medicare HMO | Source: Ambulatory Visit | Attending: Radiation Oncology | Admitting: Radiation Oncology

## 2023-04-26 ENCOUNTER — Other Ambulatory Visit: Payer: Self-pay

## 2023-04-26 DIAGNOSIS — Z510 Encounter for antineoplastic radiation therapy: Secondary | ICD-10-CM | POA: Diagnosis not present

## 2023-04-26 DIAGNOSIS — C7800 Secondary malignant neoplasm of unspecified lung: Secondary | ICD-10-CM | POA: Insufficient documentation

## 2023-04-26 DIAGNOSIS — C61 Malignant neoplasm of prostate: Secondary | ICD-10-CM | POA: Insufficient documentation

## 2023-04-29 DIAGNOSIS — C61 Malignant neoplasm of prostate: Secondary | ICD-10-CM | POA: Diagnosis not present

## 2023-05-01 ENCOUNTER — Other Ambulatory Visit (HOSPITAL_COMMUNITY): Payer: Self-pay

## 2023-05-01 MED ORDER — METFORMIN HCL 1000 MG PO TABS
1000.0000 mg | ORAL_TABLET | Freq: Two times a day (BID) | ORAL | 0 refills | Status: AC
  Filled 2023-05-01: qty 180, 90d supply, fill #0

## 2023-05-02 ENCOUNTER — Other Ambulatory Visit: Payer: Self-pay

## 2023-05-02 ENCOUNTER — Other Ambulatory Visit (HOSPITAL_COMMUNITY): Payer: Self-pay

## 2023-05-07 ENCOUNTER — Ambulatory Visit
Admission: RE | Admit: 2023-05-07 | Discharge: 2023-05-07 | Disposition: A | Discharge status: Home / Self Care | Payer: Medicare HMO | Source: Ambulatory Visit | Attending: Radiation Oncology | Admitting: Radiation Oncology

## 2023-05-07 ENCOUNTER — Other Ambulatory Visit: Payer: Self-pay

## 2023-05-07 DIAGNOSIS — Z510 Encounter for antineoplastic radiation therapy: Secondary | ICD-10-CM | POA: Insufficient documentation

## 2023-05-07 DIAGNOSIS — C61 Malignant neoplasm of prostate: Secondary | ICD-10-CM | POA: Insufficient documentation

## 2023-05-07 DIAGNOSIS — C7800 Secondary malignant neoplasm of unspecified lung: Secondary | ICD-10-CM | POA: Diagnosis not present

## 2023-05-07 LAB — RAD ONC ARIA SESSION SUMMARY
Course Elapsed Days: 0
Plan Fractions Treated to Date: 1
Plan Prescribed Dose Per Fraction: 1.8 Gy
Plan Total Fractions Prescribed: 25
Plan Total Prescribed Dose: 45 Gy
Reference Point Dosage Given to Date: 1.8 Gy
Reference Point Session Dosage Given: 1.8 Gy
Session Number: 1

## 2023-05-07 NOTE — Progress Notes (Signed)
RN spoke with patient, and patient's wife.  Patient denies any questions related to radiation treatment at this time and continues on Zytiga and Eligard for his Stage IVB adenocarcinoma of the prostate with Gleason Score of 4+4, PSA of 26.7.   Patient and patient's wife gave verbal permission for me to contact their daughter, [name] at [phone], to update on treatment.  RN left message for call back with daughter.

## 2023-05-08 ENCOUNTER — Ambulatory Visit
Admission: RE | Admit: 2023-05-08 | Discharge: 2023-05-08 | Disposition: A | Discharge status: Home / Self Care | Payer: Medicare HMO | Source: Ambulatory Visit | Attending: Radiation Oncology | Admitting: Radiation Oncology

## 2023-05-08 ENCOUNTER — Other Ambulatory Visit: Payer: Self-pay

## 2023-05-08 DIAGNOSIS — C61 Malignant neoplasm of prostate: Secondary | ICD-10-CM | POA: Diagnosis not present

## 2023-05-08 LAB — RAD ONC ARIA SESSION SUMMARY
Course Elapsed Days: 1
Plan Fractions Treated to Date: 2
Plan Prescribed Dose Per Fraction: 1.8 Gy
Plan Total Fractions Prescribed: 25
Plan Total Prescribed Dose: 45 Gy
Reference Point Dosage Given to Date: 3.6 Gy
Reference Point Session Dosage Given: 1.8 Gy
Session Number: 2

## 2023-05-09 ENCOUNTER — Other Ambulatory Visit: Payer: Self-pay

## 2023-05-09 ENCOUNTER — Ambulatory Visit
Admission: RE | Admit: 2023-05-09 | Discharge: 2023-05-09 | Disposition: A | Discharge status: Home / Self Care | Payer: Medicare HMO | Source: Ambulatory Visit | Attending: Radiation Oncology | Admitting: Radiation Oncology

## 2023-05-09 DIAGNOSIS — C61 Malignant neoplasm of prostate: Secondary | ICD-10-CM | POA: Diagnosis not present

## 2023-05-09 LAB — RAD ONC ARIA SESSION SUMMARY
Course Elapsed Days: 2
Plan Fractions Treated to Date: 3
Plan Prescribed Dose Per Fraction: 1.8 Gy
Plan Total Fractions Prescribed: 25
Plan Total Prescribed Dose: 45 Gy
Reference Point Dosage Given to Date: 5.4 Gy
Reference Point Session Dosage Given: 1.8 Gy
Session Number: 3

## 2023-05-10 ENCOUNTER — Other Ambulatory Visit (HOSPITAL_COMMUNITY): Payer: Self-pay

## 2023-05-10 ENCOUNTER — Ambulatory Visit
Admission: RE | Admit: 2023-05-10 | Discharge: 2023-05-10 | Disposition: A | Discharge status: Home / Self Care | Payer: Medicare HMO | Source: Ambulatory Visit | Attending: Radiation Oncology | Admitting: Radiation Oncology

## 2023-05-10 ENCOUNTER — Other Ambulatory Visit: Payer: Self-pay

## 2023-05-10 DIAGNOSIS — C61 Malignant neoplasm of prostate: Secondary | ICD-10-CM | POA: Diagnosis not present

## 2023-05-10 LAB — RAD ONC ARIA SESSION SUMMARY
Course Elapsed Days: 3
Plan Fractions Treated to Date: 4
Plan Prescribed Dose Per Fraction: 1.8 Gy
Plan Total Fractions Prescribed: 25
Plan Total Prescribed Dose: 45 Gy
Reference Point Dosage Given to Date: 7.2 Gy
Reference Point Session Dosage Given: 1.8 Gy
Session Number: 4

## 2023-05-13 ENCOUNTER — Other Ambulatory Visit: Payer: Self-pay

## 2023-05-13 ENCOUNTER — Ambulatory Visit
Admission: RE | Admit: 2023-05-13 | Discharge: 2023-05-13 | Disposition: A | Discharge status: Home / Self Care | Payer: Medicare HMO | Source: Ambulatory Visit | Attending: Radiation Oncology | Admitting: Radiation Oncology

## 2023-05-13 DIAGNOSIS — C61 Malignant neoplasm of prostate: Secondary | ICD-10-CM | POA: Diagnosis not present

## 2023-05-13 LAB — RAD ONC ARIA SESSION SUMMARY
Course Elapsed Days: 6
Plan Fractions Treated to Date: 5
Plan Prescribed Dose Per Fraction: 1.8 Gy
Plan Total Fractions Prescribed: 25
Plan Total Prescribed Dose: 45 Gy
Reference Point Dosage Given to Date: 9 Gy
Reference Point Session Dosage Given: 1.8 Gy
Session Number: 5

## 2023-05-14 ENCOUNTER — Other Ambulatory Visit: Payer: Self-pay

## 2023-05-14 ENCOUNTER — Ambulatory Visit
Admission: RE | Admit: 2023-05-14 | Discharge: 2023-05-14 | Disposition: A | Discharge status: Home / Self Care | Payer: Medicare HMO | Source: Ambulatory Visit | Attending: Radiation Oncology | Admitting: Radiation Oncology

## 2023-05-14 DIAGNOSIS — C61 Malignant neoplasm of prostate: Secondary | ICD-10-CM | POA: Diagnosis not present

## 2023-05-14 LAB — RAD ONC ARIA SESSION SUMMARY
Course Elapsed Days: 7
Plan Fractions Treated to Date: 6
Plan Prescribed Dose Per Fraction: 1.8 Gy
Plan Total Fractions Prescribed: 25
Plan Total Prescribed Dose: 45 Gy
Reference Point Dosage Given to Date: 10.8 Gy
Reference Point Session Dosage Given: 1.8 Gy
Session Number: 6

## 2023-05-15 ENCOUNTER — Other Ambulatory Visit: Payer: Self-pay

## 2023-05-15 ENCOUNTER — Ambulatory Visit
Admission: RE | Admit: 2023-05-15 | Discharge: 2023-05-15 | Disposition: A | Discharge status: Home / Self Care | Payer: Medicare HMO | Source: Ambulatory Visit | Attending: Radiation Oncology | Admitting: Radiation Oncology

## 2023-05-15 DIAGNOSIS — C61 Malignant neoplasm of prostate: Secondary | ICD-10-CM | POA: Diagnosis not present

## 2023-05-15 LAB — RAD ONC ARIA SESSION SUMMARY
Course Elapsed Days: 8
Plan Fractions Treated to Date: 7
Plan Prescribed Dose Per Fraction: 1.8 Gy
Plan Total Fractions Prescribed: 25
Plan Total Prescribed Dose: 45 Gy
Reference Point Dosage Given to Date: 12.6 Gy
Reference Point Session Dosage Given: 1.8 Gy
Session Number: 7

## 2023-05-16 ENCOUNTER — Other Ambulatory Visit: Payer: Self-pay

## 2023-05-16 ENCOUNTER — Ambulatory Visit
Admission: RE | Admit: 2023-05-16 | Discharge: 2023-05-16 | Disposition: A | Discharge status: Home / Self Care | Payer: Medicare HMO | Source: Ambulatory Visit | Attending: Radiation Oncology | Admitting: Radiation Oncology

## 2023-05-16 DIAGNOSIS — C61 Malignant neoplasm of prostate: Secondary | ICD-10-CM | POA: Diagnosis not present

## 2023-05-16 LAB — RAD ONC ARIA SESSION SUMMARY
Course Elapsed Days: 9
Plan Fractions Treated to Date: 8
Plan Prescribed Dose Per Fraction: 1.8 Gy
Plan Total Fractions Prescribed: 25
Plan Total Prescribed Dose: 45 Gy
Reference Point Dosage Given to Date: 14.4 Gy
Reference Point Session Dosage Given: 1.8 Gy
Session Number: 8

## 2023-05-17 ENCOUNTER — Other Ambulatory Visit: Payer: Self-pay

## 2023-05-17 ENCOUNTER — Other Ambulatory Visit: Payer: Self-pay | Admitting: Radiation Oncology

## 2023-05-17 ENCOUNTER — Ambulatory Visit
Admission: RE | Admit: 2023-05-17 | Discharge: 2023-05-17 | Disposition: A | Discharge status: Home / Self Care | Payer: Medicare HMO | Source: Ambulatory Visit | Attending: Radiation Oncology | Admitting: Radiation Oncology

## 2023-05-17 DIAGNOSIS — C61 Malignant neoplasm of prostate: Secondary | ICD-10-CM | POA: Diagnosis not present

## 2023-05-17 LAB — RAD ONC ARIA SESSION SUMMARY
Course Elapsed Days: 10
Plan Fractions Treated to Date: 9
Plan Prescribed Dose Per Fraction: 1.8 Gy
Plan Total Fractions Prescribed: 25
Plan Total Prescribed Dose: 45 Gy
Reference Point Dosage Given to Date: 16.2 Gy
Reference Point Session Dosage Given: 1.8 Gy
Session Number: 9

## 2023-05-17 MED ORDER — TAMSULOSIN HCL 0.4 MG PO CAPS: 0.4000 mg | ORAL_CAPSULE | Freq: Every day | ORAL | 5 refills | Status: DC

## 2023-05-20 ENCOUNTER — Ambulatory Visit
Admission: RE | Admit: 2023-05-20 | Discharge: 2023-05-20 | Disposition: A | Discharge status: Home / Self Care | Payer: Medicare HMO | Source: Ambulatory Visit | Attending: Radiation Oncology | Admitting: Radiation Oncology

## 2023-05-20 ENCOUNTER — Other Ambulatory Visit: Payer: Self-pay

## 2023-05-20 DIAGNOSIS — C61 Malignant neoplasm of prostate: Secondary | ICD-10-CM | POA: Diagnosis not present

## 2023-05-20 LAB — RAD ONC ARIA SESSION SUMMARY
Course Elapsed Days: 13
Plan Fractions Treated to Date: 10
Plan Prescribed Dose Per Fraction: 1.8 Gy
Plan Total Fractions Prescribed: 25
Plan Total Prescribed Dose: 45 Gy
Reference Point Dosage Given to Date: 18 Gy
Reference Point Session Dosage Given: 1.8 Gy
Session Number: 10

## 2023-05-21 ENCOUNTER — Ambulatory Visit
Admission: RE | Admit: 2023-05-21 | Discharge: 2023-05-21 | Disposition: A | Discharge status: Home / Self Care | Payer: Medicare HMO | Source: Ambulatory Visit | Attending: Radiation Oncology | Admitting: Radiation Oncology

## 2023-05-21 ENCOUNTER — Other Ambulatory Visit: Payer: Self-pay

## 2023-05-21 DIAGNOSIS — C61 Malignant neoplasm of prostate: Secondary | ICD-10-CM | POA: Diagnosis not present

## 2023-05-21 LAB — RAD ONC ARIA SESSION SUMMARY
Course Elapsed Days: 14
Plan Fractions Treated to Date: 11
Plan Prescribed Dose Per Fraction: 1.8 Gy
Plan Total Fractions Prescribed: 25
Plan Total Prescribed Dose: 45 Gy
Reference Point Dosage Given to Date: 19.8 Gy
Reference Point Session Dosage Given: 1.8 Gy
Session Number: 11

## 2023-05-22 ENCOUNTER — Other Ambulatory Visit: Payer: Self-pay

## 2023-05-22 ENCOUNTER — Ambulatory Visit
Admission: RE | Admit: 2023-05-22 | Discharge: 2023-05-22 | Disposition: A | Discharge status: Home / Self Care | Payer: Medicare HMO | Source: Ambulatory Visit | Attending: Radiation Oncology | Admitting: Radiation Oncology

## 2023-05-22 DIAGNOSIS — C61 Malignant neoplasm of prostate: Secondary | ICD-10-CM | POA: Diagnosis not present

## 2023-05-22 LAB — RAD ONC ARIA SESSION SUMMARY
Course Elapsed Days: 15
Plan Fractions Treated to Date: 12
Plan Prescribed Dose Per Fraction: 1.8 Gy
Plan Total Fractions Prescribed: 25
Plan Total Prescribed Dose: 45 Gy
Reference Point Dosage Given to Date: 21.6 Gy
Reference Point Session Dosage Given: 1.8 Gy
Session Number: 12

## 2023-05-23 ENCOUNTER — Ambulatory Visit
Admission: RE | Admit: 2023-05-23 | Discharge: 2023-05-23 | Disposition: A | Discharge status: Home / Self Care | Payer: Medicare HMO | Source: Ambulatory Visit | Attending: Radiation Oncology | Admitting: Radiation Oncology

## 2023-05-23 ENCOUNTER — Other Ambulatory Visit: Payer: Self-pay

## 2023-05-23 DIAGNOSIS — C61 Malignant neoplasm of prostate: Secondary | ICD-10-CM | POA: Diagnosis not present

## 2023-05-23 LAB — RAD ONC ARIA SESSION SUMMARY
Course Elapsed Days: 16
Plan Fractions Treated to Date: 13
Plan Prescribed Dose Per Fraction: 1.8 Gy
Plan Total Fractions Prescribed: 25
Plan Total Prescribed Dose: 45 Gy
Reference Point Dosage Given to Date: 23.4 Gy
Reference Point Session Dosage Given: 1.8 Gy
Session Number: 13

## 2023-05-24 ENCOUNTER — Other Ambulatory Visit: Payer: Self-pay

## 2023-05-24 ENCOUNTER — Ambulatory Visit: Admission: RE | Admit: 2023-05-24 | Payer: Medicare HMO | Source: Ambulatory Visit

## 2023-05-24 ENCOUNTER — Ambulatory Visit
Admission: RE | Admit: 2023-05-24 | Discharge: 2023-05-24 | Disposition: A | Discharge status: Home / Self Care | Payer: Medicare HMO | Source: Ambulatory Visit | Attending: Radiation Oncology | Admitting: Radiation Oncology

## 2023-05-24 DIAGNOSIS — C61 Malignant neoplasm of prostate: Secondary | ICD-10-CM | POA: Diagnosis not present

## 2023-05-24 LAB — RAD ONC ARIA SESSION SUMMARY
Course Elapsed Days: 17
Plan Fractions Treated to Date: 14
Plan Prescribed Dose Per Fraction: 1.8 Gy
Plan Total Fractions Prescribed: 25
Plan Total Prescribed Dose: 45 Gy
Reference Point Dosage Given to Date: 25.2 Gy
Reference Point Session Dosage Given: 1.8 Gy
Session Number: 14

## 2023-05-27 ENCOUNTER — Ambulatory Visit
Admission: RE | Admit: 2023-05-27 | Discharge: 2023-05-27 | Disposition: A | Discharge status: Home / Self Care | Payer: Medicare HMO | Source: Ambulatory Visit | Attending: Radiation Oncology | Admitting: Radiation Oncology

## 2023-05-27 ENCOUNTER — Other Ambulatory Visit: Payer: Self-pay

## 2023-05-27 DIAGNOSIS — C61 Malignant neoplasm of prostate: Secondary | ICD-10-CM | POA: Diagnosis not present

## 2023-05-27 LAB — RAD ONC ARIA SESSION SUMMARY
Course Elapsed Days: 20
Plan Fractions Treated to Date: 15
Plan Prescribed Dose Per Fraction: 1.8 Gy
Plan Total Fractions Prescribed: 25
Plan Total Prescribed Dose: 45 Gy
Reference Point Dosage Given to Date: 27 Gy
Reference Point Session Dosage Given: 1.8 Gy
Session Number: 15

## 2023-05-28 ENCOUNTER — Other Ambulatory Visit: Payer: Self-pay

## 2023-05-28 ENCOUNTER — Ambulatory Visit
Admission: RE | Admit: 2023-05-28 | Discharge: 2023-05-28 | Disposition: A | Discharge status: Home / Self Care | Payer: Medicare HMO | Source: Ambulatory Visit | Attending: Radiation Oncology | Admitting: Radiation Oncology

## 2023-05-28 DIAGNOSIS — C61 Malignant neoplasm of prostate: Secondary | ICD-10-CM | POA: Diagnosis not present

## 2023-05-28 LAB — RAD ONC ARIA SESSION SUMMARY
Course Elapsed Days: 21
Plan Fractions Treated to Date: 16
Plan Prescribed Dose Per Fraction: 1.8 Gy
Plan Total Fractions Prescribed: 25
Plan Total Prescribed Dose: 45 Gy
Reference Point Dosage Given to Date: 28.8 Gy
Reference Point Session Dosage Given: 1.8 Gy
Session Number: 16

## 2023-05-29 ENCOUNTER — Ambulatory Visit
Admission: RE | Admit: 2023-05-29 | Discharge: 2023-05-29 | Disposition: A | Discharge status: Home / Self Care | Payer: Medicare HMO | Source: Ambulatory Visit | Attending: Radiation Oncology | Admitting: Radiation Oncology

## 2023-05-29 ENCOUNTER — Other Ambulatory Visit: Payer: Self-pay

## 2023-05-29 DIAGNOSIS — C61 Malignant neoplasm of prostate: Secondary | ICD-10-CM | POA: Diagnosis not present

## 2023-05-29 LAB — RAD ONC ARIA SESSION SUMMARY
Course Elapsed Days: 22
Plan Fractions Treated to Date: 17
Plan Prescribed Dose Per Fraction: 1.8 Gy
Plan Total Fractions Prescribed: 25
Plan Total Prescribed Dose: 45 Gy
Reference Point Dosage Given to Date: 30.6 Gy
Reference Point Session Dosage Given: 1.8 Gy
Session Number: 17

## 2023-05-30 ENCOUNTER — Ambulatory Visit
Admission: RE | Admit: 2023-05-30 | Discharge: 2023-05-30 | Disposition: A | Discharge status: Home / Self Care | Payer: Medicare HMO | Source: Ambulatory Visit | Attending: Radiation Oncology | Admitting: Radiation Oncology

## 2023-05-30 ENCOUNTER — Other Ambulatory Visit: Payer: Self-pay

## 2023-05-30 ENCOUNTER — Other Ambulatory Visit (HOSPITAL_COMMUNITY): Payer: Self-pay

## 2023-05-30 DIAGNOSIS — C61 Malignant neoplasm of prostate: Secondary | ICD-10-CM | POA: Diagnosis not present

## 2023-05-30 LAB — RAD ONC ARIA SESSION SUMMARY
Course Elapsed Days: 23
Plan Fractions Treated to Date: 18
Plan Prescribed Dose Per Fraction: 1.8 Gy
Plan Total Fractions Prescribed: 25
Plan Total Prescribed Dose: 45 Gy
Reference Point Dosage Given to Date: 32.4 Gy
Reference Point Session Dosage Given: 1.8 Gy
Session Number: 18

## 2023-05-31 ENCOUNTER — Ambulatory Visit
Admission: RE | Admit: 2023-05-31 | Discharge: 2023-05-31 | Disposition: A | Discharge status: Home / Self Care | Payer: Medicare HMO | Source: Ambulatory Visit | Attending: Radiation Oncology | Admitting: Radiation Oncology

## 2023-05-31 ENCOUNTER — Ambulatory Visit: Admission: RE | Admit: 2023-05-31 | Payer: Medicare HMO | Source: Ambulatory Visit

## 2023-05-31 ENCOUNTER — Other Ambulatory Visit: Payer: Self-pay

## 2023-05-31 DIAGNOSIS — C61 Malignant neoplasm of prostate: Secondary | ICD-10-CM | POA: Diagnosis not present

## 2023-05-31 LAB — RAD ONC ARIA SESSION SUMMARY
Course Elapsed Days: 24
Plan Fractions Treated to Date: 19
Plan Prescribed Dose Per Fraction: 1.8 Gy
Plan Total Fractions Prescribed: 25
Plan Total Prescribed Dose: 45 Gy
Reference Point Dosage Given to Date: 34.2 Gy
Reference Point Session Dosage Given: 1.8 Gy
Session Number: 19

## 2023-06-04 ENCOUNTER — Other Ambulatory Visit: Payer: Self-pay

## 2023-06-04 ENCOUNTER — Ambulatory Visit
Admission: RE | Admit: 2023-06-04 | Discharge: 2023-06-04 | Disposition: A | Discharge status: Home / Self Care | Payer: Medicare HMO | Source: Ambulatory Visit | Attending: Radiation Oncology | Admitting: Radiation Oncology

## 2023-06-04 DIAGNOSIS — C61 Malignant neoplasm of prostate: Secondary | ICD-10-CM | POA: Insufficient documentation

## 2023-06-04 DIAGNOSIS — Z510 Encounter for antineoplastic radiation therapy: Secondary | ICD-10-CM | POA: Insufficient documentation

## 2023-06-04 DIAGNOSIS — C7800 Secondary malignant neoplasm of unspecified lung: Secondary | ICD-10-CM | POA: Insufficient documentation

## 2023-06-04 LAB — RAD ONC ARIA SESSION SUMMARY
Course Elapsed Days: 28
Plan Fractions Treated to Date: 20
Plan Prescribed Dose Per Fraction: 1.8 Gy
Plan Total Fractions Prescribed: 25
Plan Total Prescribed Dose: 45 Gy
Reference Point Dosage Given to Date: 36 Gy
Reference Point Session Dosage Given: 1.8 Gy
Session Number: 20

## 2023-06-05 ENCOUNTER — Other Ambulatory Visit: Payer: Self-pay

## 2023-06-05 ENCOUNTER — Ambulatory Visit
Admission: RE | Admit: 2023-06-05 | Discharge: 2023-06-05 | Disposition: A | Discharge status: Home / Self Care | Payer: Medicare HMO | Source: Ambulatory Visit | Attending: Radiation Oncology | Admitting: Radiation Oncology

## 2023-06-05 DIAGNOSIS — C61 Malignant neoplasm of prostate: Secondary | ICD-10-CM | POA: Diagnosis not present

## 2023-06-05 LAB — RAD ONC ARIA SESSION SUMMARY
Course Elapsed Days: 29
Plan Fractions Treated to Date: 21
Plan Prescribed Dose Per Fraction: 1.8 Gy
Plan Total Fractions Prescribed: 25
Plan Total Prescribed Dose: 45 Gy
Reference Point Dosage Given to Date: 37.8 Gy
Reference Point Session Dosage Given: 1.8 Gy
Session Number: 21

## 2023-06-06 ENCOUNTER — Ambulatory Visit
Admission: RE | Admit: 2023-06-06 | Discharge: 2023-06-06 | Disposition: A | Discharge status: Home / Self Care | Payer: Medicare HMO | Source: Ambulatory Visit | Attending: Radiation Oncology | Admitting: Radiation Oncology

## 2023-06-06 ENCOUNTER — Other Ambulatory Visit: Payer: Self-pay

## 2023-06-06 DIAGNOSIS — C61 Malignant neoplasm of prostate: Secondary | ICD-10-CM | POA: Diagnosis not present

## 2023-06-06 LAB — RAD ONC ARIA SESSION SUMMARY
Course Elapsed Days: 30
Plan Fractions Treated to Date: 22
Plan Prescribed Dose Per Fraction: 1.8 Gy
Plan Total Fractions Prescribed: 25
Plan Total Prescribed Dose: 45 Gy
Reference Point Dosage Given to Date: 39.6 Gy
Reference Point Session Dosage Given: 1.8 Gy
Session Number: 22

## 2023-06-07 ENCOUNTER — Ambulatory Visit
Admission: RE | Admit: 2023-06-07 | Discharge: 2023-06-07 | Disposition: A | Discharge status: Home / Self Care | Payer: Medicare HMO | Source: Ambulatory Visit | Attending: Radiation Oncology | Admitting: Radiation Oncology

## 2023-06-07 ENCOUNTER — Other Ambulatory Visit (HOSPITAL_COMMUNITY): Payer: Self-pay

## 2023-06-07 ENCOUNTER — Other Ambulatory Visit: Payer: Self-pay

## 2023-06-07 ENCOUNTER — Other Ambulatory Visit: Payer: Self-pay | Admitting: Hematology

## 2023-06-07 DIAGNOSIS — C61 Malignant neoplasm of prostate: Secondary | ICD-10-CM | POA: Diagnosis not present

## 2023-06-07 LAB — RAD ONC ARIA SESSION SUMMARY
Course Elapsed Days: 31
Plan Fractions Treated to Date: 23
Plan Prescribed Dose Per Fraction: 1.8 Gy
Plan Total Fractions Prescribed: 25
Plan Total Prescribed Dose: 45 Gy
Reference Point Dosage Given to Date: 41.4 Gy
Reference Point Session Dosage Given: 1.8 Gy
Session Number: 23

## 2023-06-07 MED ORDER — ABIRATERONE ACETATE 250 MG PO TABS
1000.0000 mg | ORAL_TABLET | Freq: Every day | ORAL | 2 refills | Status: DC
Start: 2023-06-07 — End: 2023-09-10
  Filled 2023-06-07: qty 120, 30d supply, fill #0
  Filled 2023-07-03 – 2023-07-05 (×3): qty 120, 30d supply, fill #1
  Filled 2023-08-14 (×2): qty 120, 30d supply, fill #2

## 2023-06-10 ENCOUNTER — Other Ambulatory Visit: Payer: Self-pay

## 2023-06-10 ENCOUNTER — Other Ambulatory Visit (HOSPITAL_COMMUNITY): Payer: Self-pay

## 2023-06-10 ENCOUNTER — Ambulatory Visit
Admission: RE | Admit: 2023-06-10 | Discharge: 2023-06-10 | Disposition: A | Discharge status: Home / Self Care | Payer: Medicare HMO | Source: Ambulatory Visit | Attending: Radiation Oncology | Admitting: Radiation Oncology

## 2023-06-10 DIAGNOSIS — C61 Malignant neoplasm of prostate: Secondary | ICD-10-CM | POA: Diagnosis not present

## 2023-06-10 LAB — RAD ONC ARIA SESSION SUMMARY
Course Elapsed Days: 34
Plan Fractions Treated to Date: 24
Plan Prescribed Dose Per Fraction: 1.8 Gy
Plan Total Fractions Prescribed: 25
Plan Total Prescribed Dose: 45 Gy
Reference Point Dosage Given to Date: 43.2 Gy
Reference Point Session Dosage Given: 1.8 Gy
Session Number: 24

## 2023-06-11 ENCOUNTER — Ambulatory Visit
Admission: RE | Admit: 2023-06-11 | Discharge: 2023-06-11 | Disposition: A | Discharge status: Home / Self Care | Payer: Medicare HMO | Source: Ambulatory Visit | Attending: Radiation Oncology | Admitting: Radiation Oncology

## 2023-06-11 ENCOUNTER — Other Ambulatory Visit (HOSPITAL_COMMUNITY): Payer: Self-pay

## 2023-06-11 ENCOUNTER — Other Ambulatory Visit: Payer: Self-pay

## 2023-06-11 DIAGNOSIS — C61 Malignant neoplasm of prostate: Secondary | ICD-10-CM | POA: Diagnosis not present

## 2023-06-11 LAB — RAD ONC ARIA SESSION SUMMARY
Course Elapsed Days: 35
Plan Fractions Treated to Date: 25
Plan Prescribed Dose Per Fraction: 1.8 Gy
Plan Total Fractions Prescribed: 25
Plan Total Prescribed Dose: 45 Gy
Reference Point Dosage Given to Date: 45 Gy
Reference Point Session Dosage Given: 1.8 Gy
Session Number: 25

## 2023-06-12 ENCOUNTER — Other Ambulatory Visit: Payer: Self-pay

## 2023-06-12 ENCOUNTER — Ambulatory Visit
Admission: RE | Admit: 2023-06-12 | Discharge: 2023-06-12 | Disposition: A | Discharge status: Home / Self Care | Payer: Medicare HMO | Source: Ambulatory Visit | Attending: Radiation Oncology | Admitting: Radiation Oncology

## 2023-06-12 DIAGNOSIS — C61 Malignant neoplasm of prostate: Secondary | ICD-10-CM | POA: Diagnosis not present

## 2023-06-12 LAB — RAD ONC ARIA SESSION SUMMARY
Course Elapsed Days: 36
Plan Fractions Treated to Date: 1
Plan Prescribed Dose Per Fraction: 2 Gy
Plan Total Fractions Prescribed: 15
Plan Total Prescribed Dose: 30 Gy
Reference Point Dosage Given to Date: 2 Gy
Reference Point Session Dosage Given: 2 Gy
Session Number: 26

## 2023-06-13 ENCOUNTER — Other Ambulatory Visit: Payer: Self-pay

## 2023-06-13 ENCOUNTER — Ambulatory Visit
Admission: RE | Admit: 2023-06-13 | Discharge: 2023-06-13 | Disposition: A | Discharge status: Home / Self Care | Payer: Medicare HMO | Source: Ambulatory Visit | Attending: Radiation Oncology | Admitting: Radiation Oncology

## 2023-06-13 DIAGNOSIS — C61 Malignant neoplasm of prostate: Secondary | ICD-10-CM | POA: Diagnosis not present

## 2023-06-13 LAB — RAD ONC ARIA SESSION SUMMARY
Course Elapsed Days: 37
Plan Fractions Treated to Date: 2
Plan Prescribed Dose Per Fraction: 2 Gy
Plan Total Fractions Prescribed: 15
Plan Total Prescribed Dose: 30 Gy
Reference Point Dosage Given to Date: 4 Gy
Reference Point Session Dosage Given: 2 Gy
Session Number: 27

## 2023-06-14 ENCOUNTER — Other Ambulatory Visit: Payer: Self-pay

## 2023-06-14 ENCOUNTER — Ambulatory Visit
Admission: RE | Admit: 2023-06-14 | Discharge: 2023-06-14 | Disposition: A | Discharge status: Home / Self Care | Payer: Medicare HMO | Source: Ambulatory Visit | Attending: Radiation Oncology | Admitting: Radiation Oncology

## 2023-06-14 DIAGNOSIS — C61 Malignant neoplasm of prostate: Secondary | ICD-10-CM | POA: Diagnosis not present

## 2023-06-14 LAB — RAD ONC ARIA SESSION SUMMARY
Course Elapsed Days: 38
Plan Fractions Treated to Date: 3
Plan Prescribed Dose Per Fraction: 2 Gy
Plan Total Fractions Prescribed: 15
Plan Total Prescribed Dose: 30 Gy
Reference Point Dosage Given to Date: 6 Gy
Reference Point Session Dosage Given: 2 Gy
Session Number: 28

## 2023-06-17 ENCOUNTER — Other Ambulatory Visit: Payer: Self-pay

## 2023-06-17 ENCOUNTER — Ambulatory Visit
Admission: RE | Admit: 2023-06-17 | Discharge: 2023-06-17 | Disposition: A | Discharge status: Home / Self Care | Payer: Medicare HMO | Source: Ambulatory Visit | Attending: Radiation Oncology | Admitting: Radiation Oncology

## 2023-06-17 DIAGNOSIS — C61 Malignant neoplasm of prostate: Secondary | ICD-10-CM | POA: Diagnosis not present

## 2023-06-17 LAB — RAD ONC ARIA SESSION SUMMARY
Course Elapsed Days: 41
Plan Fractions Treated to Date: 4
Plan Prescribed Dose Per Fraction: 2 Gy
Plan Total Fractions Prescribed: 15
Plan Total Prescribed Dose: 30 Gy
Reference Point Dosage Given to Date: 8 Gy
Reference Point Session Dosage Given: 2 Gy
Session Number: 29

## 2023-06-18 ENCOUNTER — Other Ambulatory Visit: Payer: Self-pay

## 2023-06-18 ENCOUNTER — Ambulatory Visit
Admission: RE | Admit: 2023-06-18 | Discharge: 2023-06-18 | Disposition: A | Discharge status: Home / Self Care | Payer: Medicare HMO | Source: Ambulatory Visit | Attending: Radiation Oncology | Admitting: Radiation Oncology

## 2023-06-18 DIAGNOSIS — C61 Malignant neoplasm of prostate: Secondary | ICD-10-CM | POA: Diagnosis not present

## 2023-06-18 LAB — RAD ONC ARIA SESSION SUMMARY
Course Elapsed Days: 42
Plan Fractions Treated to Date: 5
Plan Prescribed Dose Per Fraction: 2 Gy
Plan Total Fractions Prescribed: 15
Plan Total Prescribed Dose: 30 Gy
Reference Point Dosage Given to Date: 10 Gy
Reference Point Session Dosage Given: 2 Gy
Session Number: 30

## 2023-06-19 ENCOUNTER — Ambulatory Visit
Admission: RE | Admit: 2023-06-19 | Discharge: 2023-06-19 | Disposition: A | Discharge status: Home / Self Care | Payer: Medicare HMO | Source: Ambulatory Visit | Attending: Radiation Oncology | Admitting: Radiation Oncology

## 2023-06-19 ENCOUNTER — Other Ambulatory Visit: Payer: Self-pay

## 2023-06-19 DIAGNOSIS — C61 Malignant neoplasm of prostate: Secondary | ICD-10-CM | POA: Diagnosis not present

## 2023-06-19 LAB — RAD ONC ARIA SESSION SUMMARY
Course Elapsed Days: 43
Plan Fractions Treated to Date: 6
Plan Prescribed Dose Per Fraction: 2 Gy
Plan Total Fractions Prescribed: 15
Plan Total Prescribed Dose: 30 Gy
Reference Point Dosage Given to Date: 12 Gy
Reference Point Session Dosage Given: 2 Gy
Session Number: 31

## 2023-06-20 ENCOUNTER — Other Ambulatory Visit: Payer: Self-pay

## 2023-06-20 ENCOUNTER — Ambulatory Visit
Admission: RE | Admit: 2023-06-20 | Discharge: 2023-06-20 | Disposition: A | Discharge status: Home / Self Care | Payer: Medicare HMO | Source: Ambulatory Visit | Attending: Radiation Oncology | Admitting: Radiation Oncology

## 2023-06-20 DIAGNOSIS — C61 Malignant neoplasm of prostate: Secondary | ICD-10-CM | POA: Diagnosis not present

## 2023-06-20 LAB — RAD ONC ARIA SESSION SUMMARY
Course Elapsed Days: 44
Plan Fractions Treated to Date: 7
Plan Prescribed Dose Per Fraction: 2 Gy
Plan Total Fractions Prescribed: 15
Plan Total Prescribed Dose: 30 Gy
Reference Point Dosage Given to Date: 14 Gy
Reference Point Session Dosage Given: 2 Gy
Session Number: 32

## 2023-06-21 ENCOUNTER — Ambulatory Visit
Admission: RE | Admit: 2023-06-21 | Discharge: 2023-06-21 | Disposition: A | Discharge status: Home / Self Care | Payer: Medicare HMO | Source: Ambulatory Visit | Attending: Radiation Oncology | Admitting: Radiation Oncology

## 2023-06-21 ENCOUNTER — Other Ambulatory Visit: Payer: Self-pay

## 2023-06-21 ENCOUNTER — Telehealth: Payer: Self-pay | Admitting: *Deleted

## 2023-06-21 DIAGNOSIS — C61 Malignant neoplasm of prostate: Secondary | ICD-10-CM | POA: Diagnosis not present

## 2023-06-21 LAB — RAD ONC ARIA SESSION SUMMARY
Course Elapsed Days: 45
Plan Fractions Treated to Date: 8
Plan Prescribed Dose Per Fraction: 2 Gy
Plan Total Fractions Prescribed: 15
Plan Total Prescribed Dose: 30 Gy
Reference Point Dosage Given to Date: 16 Gy
Reference Point Session Dosage Given: 2 Gy
Session Number: 33

## 2023-06-21 NOTE — Telephone Encounter (Signed)
CALLED PATIENT TO INFORM OF FU APPT. WITH DR. Cardell Peach ON 09-17-23 - ARRIVAL TIME- 11 AM, SPOKE WITH PATIENT'S WIFE- [name] AND SHE IS AWARE OF THIS APPT.

## 2023-06-24 ENCOUNTER — Ambulatory Visit: Payer: Medicare HMO

## 2023-06-24 ENCOUNTER — Other Ambulatory Visit: Payer: Self-pay

## 2023-06-24 ENCOUNTER — Ambulatory Visit
Admission: RE | Admit: 2023-06-24 | Discharge: 2023-06-24 | Disposition: A | Discharge status: Home / Self Care | Payer: Medicare HMO | Source: Ambulatory Visit | Attending: Radiation Oncology | Admitting: Radiation Oncology

## 2023-06-24 DIAGNOSIS — C61 Malignant neoplasm of prostate: Secondary | ICD-10-CM | POA: Diagnosis not present

## 2023-06-24 LAB — RAD ONC ARIA SESSION SUMMARY
Course Elapsed Days: 48
Plan Fractions Treated to Date: 9
Plan Prescribed Dose Per Fraction: 2 Gy
Plan Total Fractions Prescribed: 15
Plan Total Prescribed Dose: 30 Gy
Reference Point Dosage Given to Date: 18 Gy
Reference Point Session Dosage Given: 2 Gy
Session Number: 34

## 2023-06-25 ENCOUNTER — Other Ambulatory Visit: Payer: Self-pay

## 2023-06-25 ENCOUNTER — Ambulatory Visit
Admission: RE | Admit: 2023-06-25 | Discharge: 2023-06-25 | Disposition: A | Discharge status: Home / Self Care | Payer: Medicare HMO | Source: Ambulatory Visit | Attending: Radiation Oncology | Admitting: Radiation Oncology

## 2023-06-25 DIAGNOSIS — C61 Malignant neoplasm of prostate: Secondary | ICD-10-CM | POA: Diagnosis not present

## 2023-06-25 LAB — RAD ONC ARIA SESSION SUMMARY
Course Elapsed Days: 49
Plan Fractions Treated to Date: 10
Plan Prescribed Dose Per Fraction: 2 Gy
Plan Total Fractions Prescribed: 15
Plan Total Prescribed Dose: 30 Gy
Reference Point Dosage Given to Date: 20 Gy
Reference Point Session Dosage Given: 2 Gy
Session Number: 35

## 2023-06-26 ENCOUNTER — Ambulatory Visit: Payer: Medicare HMO

## 2023-06-27 ENCOUNTER — Other Ambulatory Visit: Payer: Self-pay

## 2023-06-27 ENCOUNTER — Ambulatory Visit
Admission: RE | Admit: 2023-06-27 | Discharge: 2023-06-27 | Disposition: A | Discharge status: Home / Self Care | Payer: Medicare HMO | Source: Ambulatory Visit | Attending: Radiation Oncology | Admitting: Radiation Oncology

## 2023-06-27 ENCOUNTER — Ambulatory Visit: Payer: Medicare HMO

## 2023-06-27 DIAGNOSIS — C61 Malignant neoplasm of prostate: Secondary | ICD-10-CM | POA: Diagnosis not present

## 2023-06-27 LAB — RAD ONC ARIA SESSION SUMMARY
Course Elapsed Days: 51
Plan Fractions Treated to Date: 11
Plan Prescribed Dose Per Fraction: 2 Gy
Plan Total Fractions Prescribed: 15
Plan Total Prescribed Dose: 30 Gy
Reference Point Dosage Given to Date: 22 Gy
Reference Point Session Dosage Given: 2 Gy
Session Number: 36

## 2023-06-28 ENCOUNTER — Other Ambulatory Visit: Payer: Self-pay

## 2023-06-28 ENCOUNTER — Ambulatory Visit
Admission: RE | Admit: 2023-06-28 | Discharge: 2023-06-28 | Disposition: A | Discharge status: Home / Self Care | Payer: Medicare HMO | Source: Ambulatory Visit | Attending: Radiation Oncology | Admitting: Radiation Oncology

## 2023-06-28 DIAGNOSIS — C61 Malignant neoplasm of prostate: Secondary | ICD-10-CM | POA: Diagnosis not present

## 2023-06-28 LAB — RAD ONC ARIA SESSION SUMMARY
Course Elapsed Days: 52
Plan Fractions Treated to Date: 12
Plan Prescribed Dose Per Fraction: 2 Gy
Plan Total Fractions Prescribed: 15
Plan Total Prescribed Dose: 30 Gy
Reference Point Dosage Given to Date: 24 Gy
Reference Point Session Dosage Given: 2 Gy
Session Number: 37

## 2023-07-01 ENCOUNTER — Ambulatory Visit
Admission: RE | Admit: 2023-07-01 | Discharge: 2023-07-01 | Disposition: A | Discharge status: Home / Self Care | Payer: Medicare HMO | Source: Ambulatory Visit | Attending: Radiation Oncology | Admitting: Radiation Oncology

## 2023-07-01 ENCOUNTER — Other Ambulatory Visit: Payer: Self-pay

## 2023-07-01 DIAGNOSIS — C61 Malignant neoplasm of prostate: Secondary | ICD-10-CM | POA: Diagnosis not present

## 2023-07-01 LAB — RAD ONC ARIA SESSION SUMMARY
Course Elapsed Days: 55
Plan Fractions Treated to Date: 13
Plan Prescribed Dose Per Fraction: 2 Gy
Plan Total Fractions Prescribed: 15
Plan Total Prescribed Dose: 30 Gy
Reference Point Dosage Given to Date: 26 Gy
Reference Point Session Dosage Given: 2 Gy
Session Number: 38

## 2023-07-02 ENCOUNTER — Ambulatory Visit: Payer: Medicare HMO

## 2023-07-02 ENCOUNTER — Other Ambulatory Visit: Payer: Self-pay

## 2023-07-02 ENCOUNTER — Ambulatory Visit
Admission: RE | Admit: 2023-07-02 | Discharge: 2023-07-02 | Disposition: A | Discharge status: Home / Self Care | Payer: Medicare HMO | Source: Ambulatory Visit | Attending: Radiation Oncology | Admitting: Radiation Oncology

## 2023-07-02 DIAGNOSIS — C61 Malignant neoplasm of prostate: Secondary | ICD-10-CM | POA: Diagnosis present

## 2023-07-02 DIAGNOSIS — Z510 Encounter for antineoplastic radiation therapy: Secondary | ICD-10-CM | POA: Diagnosis not present

## 2023-07-02 DIAGNOSIS — C7800 Secondary malignant neoplasm of unspecified lung: Secondary | ICD-10-CM | POA: Insufficient documentation

## 2023-07-02 LAB — RAD ONC ARIA SESSION SUMMARY
Course Elapsed Days: 56
Plan Fractions Treated to Date: 14
Plan Prescribed Dose Per Fraction: 2 Gy
Plan Total Fractions Prescribed: 15
Plan Total Prescribed Dose: 30 Gy
Reference Point Dosage Given to Date: 28 Gy
Reference Point Session Dosage Given: 2 Gy
Session Number: 39

## 2023-07-03 ENCOUNTER — Other Ambulatory Visit: Payer: Self-pay

## 2023-07-03 ENCOUNTER — Ambulatory Visit
Admission: RE | Admit: 2023-07-03 | Discharge: 2023-07-03 | Disposition: A | Discharge status: Home / Self Care | Payer: Medicare HMO | Source: Ambulatory Visit | Attending: Radiation Oncology | Admitting: Radiation Oncology

## 2023-07-03 DIAGNOSIS — C61 Malignant neoplasm of prostate: Secondary | ICD-10-CM | POA: Diagnosis not present

## 2023-07-03 LAB — RAD ONC ARIA SESSION SUMMARY
Course Elapsed Days: 57
Plan Fractions Treated to Date: 15
Plan Prescribed Dose Per Fraction: 2 Gy
Plan Total Fractions Prescribed: 15
Plan Total Prescribed Dose: 30 Gy
Reference Point Dosage Given to Date: 30 Gy
Reference Point Session Dosage Given: 2 Gy
Session Number: 40

## 2023-07-03 NOTE — Progress Notes (Signed)
Specialty Pharmacy Refill Coordination Note  [name] is a 77 y.o. male contacted today regarding refills of specialty medication(s) Abiraterone Acetate   Patient requested [name] at [facility] at [address]   Delivery date: Patient will pick up on 07/05/2023 Verified address: [facility]   Medication will be filled on 07/04/23.

## 2023-07-03 NOTE — Progress Notes (Signed)
Specialty Pharmacy Ongoing Clinical Assessment Note  [name] is a 77 y.o. male who is being followed by the specialty pharmacy service for RxSp Oncology   Patient's specialty medication(s) reviewed today: Abiraterone Acetate   Missed doses in the last 4 weeks: 0   Patient did not have any additional questions or concerns.   Therapeutic benefit summary: Patient is achieving benefit   Adverse events/side effects summary: No adverse events/side effects   Patient's therapy is appropriate to: Continue    Goals Addressed   None     Follow up:  3 months

## 2023-07-04 ENCOUNTER — Other Ambulatory Visit: Payer: Self-pay

## 2023-07-04 NOTE — Progress Notes (Signed)
Patient was a RadOnc Consult on 02/19/23 for his stage IVB adenocarcinoma of the prostate with Gleason Score of 4+4, PSA of 26.7 low-volume metastatic disease involving the lungs.  Patient proceed with treatment recommendations of  8 weeks of external beam therapy concurrent with ADT and had his final radiation treatment on 07/03/23.   Patient is scheduled for a post treatment nurse call on 08/20/23 and has a urology follow up with Dr. Cardell Peach on 09/17/23.  Patient will also remain under the care of Dr. Mosetta Putt for his abiraterone.

## 2023-07-04 NOTE — Radiation Completion Notes (Addendum)
  Radiation Oncology         [phone] ________________________________  Name: [name] MRN: [id number]  Date: 07/03/2023  DOB: [birth year]  Referring Physician: Karoline Caldwell, M.D. Date of Service: 2023-07-04 Radiation Oncologist: Margaretmary Bayley, M.D.  [facility] [place]     RADIATION ONCOLOGY END OF TREATMENT NOTE     Diagnosis: 77 y.o. gentleman with Stage IVB adenocarcinoma of the prostate with Gleason Score of 4+4 and PSA of 26.7 with low-volume metastatic disease involving the lungs   Intent: Curative     ==========DELIVERED PLANS==========  First Treatment Date: 2023-05-07 - Last Treatment Date: 2023-07-03   Plan Name: Prostate_Pelv Site: Prostate Technique: IMRT Mode: Photon Dose Per Fraction: 1.8 Gy Prescribed Dose (Delivered / Prescribed): 45 Gy / 45 Gy Prescribed Fxs (Delivered / Prescribed): 25 / 25   Plan Name: Prostate_Bst Site: Prostate Technique: IMRT Mode: Photon Dose Per Fraction: 2 Gy Prescribed Dose (Delivered / Prescribed): 30 Gy / 30 Gy Prescribed Fxs (Delivered / Prescribed): 15 / 15     ==========ON TREATMENT VISIT DATES========== 2023-05-10, 2023-05-17, 2023-05-24, 2023-05-31, 2023-06-07, 2023-06-12, 2023-06-21, 2023-06-28    See weekly On Treatment Notes in Epic for details. He tolerated the daily radiation treatments relatively well with only minor urinary symptoms and modest fatigue.  The patient will receive a call in about one month from the radiation oncology department. He will continue follow up with his urologist, Dr. Cardell Peach and medical oncologist, Dr. Mosetta Putt as well.  ------------------------------------------------   [name], MD [facility]  Radiation Oncology Direct Dial: [phone]  Fax: [phone] [email or web address]  Skype  LinkedIn

## 2023-07-05 ENCOUNTER — Other Ambulatory Visit: Payer: Self-pay

## 2023-07-05 ENCOUNTER — Other Ambulatory Visit (HOSPITAL_COMMUNITY): Payer: Self-pay

## 2023-07-05 MED ORDER — TAMSULOSIN HCL 0.4 MG PO CAPS
0.4000 mg | ORAL_CAPSULE | Freq: Every day | ORAL | 5 refills | Status: AC
  Filled 2023-07-05: qty 30, 30d supply, fill #0

## 2023-07-08 ENCOUNTER — Other Ambulatory Visit (HOSPITAL_COMMUNITY): Payer: Self-pay

## 2023-07-08 ENCOUNTER — Other Ambulatory Visit: Payer: Self-pay

## 2023-07-08 NOTE — Progress Notes (Signed)
Patient called out patient pharmacy & wants Mail I have called patient twice on Friday & was not able to speak with patient.. Medication was in route to [facility] on Friday. Spoke with [facility] & they sent the medication back to [facility] on Monday 07/08/23 in order to be Mail to Patient.  Ship 07/08/23

## 2023-07-15 NOTE — Assessment & Plan Note (Signed)
castration sensitive, PSA 26.7, Gleason score 4+4 = 8 -This was discovered on screening PSA, which was 27 on July 28, 2022, repeated PSA in February 2024 was 26.7.  He is asymptomatic, without significant urinary symptoms or other new symptoms. -His PSMA scan showed significant hypermetabolic uptake in the entire prostate, and multiple hypermetabolic small lung nodules, consistent with pulmonary metastasis.  No bone, lymph nodes or liver metastasis, he has low volume metastasis.  -We discussed his incurable nature of prostate cancer, and a systemic treatment is his main therapy -He has seen urologist Dr. Benancio Deeds and started androgen deprivation with Orgovyx loading dose for first month, this has been swtiched to Eligard injection in early July 2024. -Patient has met radiation oncologist Dr. Kathrynn Running, given his low volume of metastatic disease, he was offered definitive radiation with LT-ADT +/- multimodal androgen blockade, concurrent with 8 weeks of external radiation which he completed on 07/03/2023 -I started him on abiraterone and prednisone June 2024, he is tolerating very well.   -His PSA has dropped significantly to 3.7 on 03/07/2023, indicating good response to treatment.

## 2023-07-16 ENCOUNTER — Encounter: Payer: Self-pay | Admitting: Hematology

## 2023-07-16 ENCOUNTER — Inpatient Hospital Stay: Payer: Medicare HMO | Attending: Hematology

## 2023-07-16 ENCOUNTER — Inpatient Hospital Stay (HOSPITAL_BASED_OUTPATIENT_CLINIC_OR_DEPARTMENT_OTHER): Payer: Medicare HMO | Admitting: Hematology

## 2023-07-16 VITALS — BP 147/79 | HR 67 | Temp 97.5°F | Ht 67.0 in | Wt 187.3 lb

## 2023-07-16 DIAGNOSIS — E1136 Type 2 diabetes mellitus with diabetic cataract: Secondary | ICD-10-CM | POA: Insufficient documentation

## 2023-07-16 DIAGNOSIS — K219 Gastro-esophageal reflux disease without esophagitis: Secondary | ICD-10-CM | POA: Diagnosis not present

## 2023-07-16 DIAGNOSIS — Z923 Personal history of irradiation: Secondary | ICD-10-CM | POA: Diagnosis not present

## 2023-07-16 DIAGNOSIS — Z794 Long term (current) use of insulin: Secondary | ICD-10-CM | POA: Insufficient documentation

## 2023-07-16 DIAGNOSIS — I2510 Atherosclerotic heart disease of native coronary artery without angina pectoris: Secondary | ICD-10-CM | POA: Diagnosis not present

## 2023-07-16 DIAGNOSIS — C7800 Secondary malignant neoplasm of unspecified lung: Secondary | ICD-10-CM | POA: Diagnosis not present

## 2023-07-16 DIAGNOSIS — Z7952 Long term (current) use of systemic steroids: Secondary | ICD-10-CM | POA: Insufficient documentation

## 2023-07-16 DIAGNOSIS — Z79899 Other long term (current) drug therapy: Secondary | ICD-10-CM | POA: Diagnosis not present

## 2023-07-16 DIAGNOSIS — C61 Malignant neoplasm of prostate: Secondary | ICD-10-CM | POA: Diagnosis present

## 2023-07-16 DIAGNOSIS — K7460 Unspecified cirrhosis of liver: Secondary | ICD-10-CM | POA: Insufficient documentation

## 2023-07-16 DIAGNOSIS — Z7984 Long term (current) use of oral hypoglycemic drugs: Secondary | ICD-10-CM | POA: Insufficient documentation

## 2023-07-16 DIAGNOSIS — Z7982 Long term (current) use of aspirin: Secondary | ICD-10-CM | POA: Insufficient documentation

## 2023-07-16 DIAGNOSIS — D649 Anemia, unspecified: Secondary | ICD-10-CM | POA: Diagnosis not present

## 2023-07-16 DIAGNOSIS — E871 Hypo-osmolality and hyponatremia: Secondary | ICD-10-CM | POA: Insufficient documentation

## 2023-07-16 LAB — CBC WITH DIFFERENTIAL/PLATELET
Abs Immature Granulocytes: 0.06 10*3/uL (ref 0.00–0.07)
Basophils Absolute: 0 10*3/uL (ref 0.0–0.1)
Basophils Relative: 0 %
Eosinophils Absolute: 0 10*3/uL (ref 0.0–0.5)
Eosinophils Relative: 1 %
HCT: 33.7 % — ABNORMAL LOW (ref 39.0–52.0)
Hemoglobin: 12.1 g/dL — ABNORMAL LOW (ref 13.0–17.0)
Immature Granulocytes: 1 %
Lymphocytes Relative: 9 %
Lymphs Abs: 0.6 10*3/uL — ABNORMAL LOW (ref 0.7–4.0)
MCH: 29.6 pg (ref 26.0–34.0)
MCHC: 35.9 g/dL (ref 30.0–36.0)
MCV: 82.4 fL (ref 80.0–100.0)
Monocytes Absolute: 0.4 10*3/uL (ref 0.1–1.0)
Monocytes Relative: 5 %
Neutro Abs: 5.8 10*3/uL (ref 1.7–7.7)
Neutrophils Relative %: 84 %
Platelets: 148 10*3/uL — ABNORMAL LOW (ref 150–400)
RBC: 4.09 MIL/uL — ABNORMAL LOW (ref 4.22–5.81)
RDW: 15.9 % — ABNORMAL HIGH (ref 11.5–15.5)
WBC: 6.9 10*3/uL (ref 4.0–10.5)
nRBC: 0 % (ref 0.0–0.2)

## 2023-07-16 LAB — COMPREHENSIVE METABOLIC PANEL
ALT: 10 U/L (ref 0–44)
AST: 12 U/L — ABNORMAL LOW (ref 15–41)
Albumin: 4 g/dL (ref 3.5–5.0)
Alkaline Phosphatase: 81 U/L (ref 38–126)
Anion gap: 7 (ref 5–15)
BUN: 19 mg/dL (ref 8–23)
CO2: 25 mmol/L (ref 22–32)
Calcium: 9.4 mg/dL (ref 8.9–10.3)
Chloride: 99 mmol/L (ref 98–111)
Creatinine, Ser: 1.1 mg/dL (ref 0.61–1.24)
GFR, Estimated: >60 mL/min (ref >60)
Glucose, Bld: 142 mg/dL — ABNORMAL HIGH (ref 70–99)
Potassium: 4.6 mmol/L (ref 3.5–5.1)
Sodium: 131 mmol/L — ABNORMAL LOW (ref 135–145)
Total Bilirubin: 1 mg/dL (ref 0.3–1.2)
Total Protein: 7 g/dL (ref 6.5–8.1)

## 2023-07-16 NOTE — Progress Notes (Signed)
[facility]   Telephone:(336) [phone] Fax:(336) [phone]   Clinic Follow up Note   Patient Care Team: [name], MD as PCP - General (Family Medicine) Cherlyn Cushing, RN as Oncology Nurse Navigator Malachy Mood, MD as Consulting Physician (Hematology and Oncology) Jannifer Hick, MD as Consulting Physician (Urology)  Date of Service:  07/16/2023  CHIEF COMPLAINT: f/u of metastatic prostate cancer  CURRENT THERAPY:  Eligard every 6 months and Zytiga/prednisone  Oncology History   Malignant neoplasm of prostate [facility]) castration sensitive, PSA 26.7, Gleason score 4+4 = 8 -This was discovered on screening PSA, which was 27 on July 28, 2022, repeated PSA in February 2024 was 26.7.  He is asymptomatic, without significant urinary symptoms or other new symptoms. -His PSMA scan showed significant hypermetabolic uptake in the entire prostate, and multiple hypermetabolic small lung nodules, consistent with pulmonary metastasis.  No bone, lymph nodes or liver metastasis, he has low volume metastasis.  -We discussed his incurable nature of prostate cancer, and a systemic treatment is his main therapy -He has seen urologist Dr. Benancio Deeds and started androgen deprivation with Orgovyx loading dose for first month, this has been swtiched to Eligard injection in early July 2024. -Patient has met radiation oncologist Dr. Kathrynn Running, given his low volume of metastatic disease, he was offered definitive radiation with LT-ADT +/- multimodal androgen blockade, concurrent with 8 weeks of external radiation which he completed on 07/03/2023 -I started him on abiraterone and prednisone June 2024, he is tolerating very well.   -His PSA has dropped significantly to 3.7 on 03/07/2023, indicating good response to treatment.   Assessment and Plan    Metastatic Prostate Cancer Recently completed radiation therapy with no reported side effects. Currently on Zytiga and prednisone, he is tolerating  well overall.  However he would rather to be off Zytiga if possible.  Due to his metastatic disease, I strongly encouraged him to continue Zytiga in addition to Eligard. -Discussed the potential for disease progression on Zytiga and the possibility of needing different treatments in the future. -alternatively, we can consider discontinuing Zytiga after two years if PSA remains undetectable and patient experiences significant discomfort. -Transition care to Dr. Cardell Peach for continued management of prostate cancer and prescription of Zytiga.  Mild Anemia Likely secondary to radiation therapy. -Expect recovery post-radiation therapy.  Hyponatremia Possibly due to increased bowel movements. -Increase salt intake. -Consider drinking Gatorade or Pedialyte to prevent dehydration and low sodium.  Follow-up Open follow-up with the option to contact if problems arise. PSA results pending and will be communicated to the patient.  Patient will follow-up with Dr. Cardell Peach at [facility] urology for management of his prostate cancer.        SUMMARY OF ONCOLOGIC HISTORY: Oncology History Overview Note   Cancer Staging  Malignant neoplasm of prostate [facility]) Staging form: Prostate, AJCC 8th Edition - Clinical stage from 12/24/2022: Stage IVB (cT2a, cN0, cM1c, PSA: 27, Grade Group: 4) - Signed by Marcello Fennel, PA-C on 02/18/2023 Histopathologic type: Adenocarcinoma, NOS Stage prefix: Initial diagnosis Prostate specific antigen (PSA) range: 20 or greater Gleason primary pattern: 4 Gleason secondary pattern: 4 Gleason score: 8 Histologic grading system: 5 grade system Number of biopsy cores examined: 12 Number of biopsy cores positive: 11 Location of positive needle core biopsies: Both sides     Malignant neoplasm of prostate (HCC)  12/24/2022 Cancer Staging   Staging form: Prostate, AJCC 8th Edition - Clinical stage from 12/24/2022: Stage IVB (cT2a, cN0, cM1c, PSA: 27, Grade Group:  4) - Signed by Marcello Fennel, PA-C on 02/18/2023 Histopathologic type: Adenocarcinoma, NOS Stage prefix: Initial diagnosis Prostate specific antigen (PSA) range: 20 or greater Gleason primary pattern: 4 Gleason secondary pattern: 4 Gleason score: 8 Histologic grading system: 5 grade system Number of biopsy cores examined: 12 Number of biopsy cores positive: 11 Location of positive needle core biopsies: Both sides   02/04/2023 PET scan    IMPRESSION: 1. Intense radiotracer activity involving the entirety of the prostate gland consistent with primary prostate adenocarcinoma. 2. There are bilateral very small nodules which have intense radiotracer activity. These nodules are new from comparison CT. While an atypical pattern, favor prostate cancer pulmonary metastasis. 3. No pelvic lymphadenopathy, retroperitoneal lymphadenopathy or thoracic metastatic adenopathy. 4. No liver metastasis or skeletal metastasis     02/18/2023 Initial Diagnosis   Malignant neoplasm of prostate [facility])    Pathology Results   Prostate Adenocarcinoma Gleason Score 4+4=8  PSA: 26.7      Discussed the use of AI scribe software for clinical note transcription with the patient, who gave verbal consent to proceed.  History of Present Illness   A 77 year old gentleman with a history of liver transplant and open-heart surgery presents for follow-up of metastatic prostate cancer. He recently completed radiation therapy, which he tolerated well without any significant side effects. He remained active during the treatment period, engaging in physical work at home. He denies any pain or discomfort. He has been maintaining a good appetite, as evidenced by a recent weight gain.  The patient is currently on Zytiga and prednisone, which he takes as prescribed. However, he reports feeling "rotten" on Zytiga. He also reports frequent bowel movements, particularly in the mornings. He has a desire to reduce his medication load due to his extensive  medical history and the number of pills he takes daily.         All other systems were reviewed with the patient and are negative.  MEDICAL HISTORY:  Past Medical History:  Diagnosis Date   Arthritis    psoriatic   Cirrhosis (HCC)    Coronary artery disease    Diabetes mellitus without complication (HCC) Type 2    GERD (gastroesophageal reflux disease)    Low magnesium levels    Prostate cancer (HCC)    Psoriasis    syncopy 05/06/2022   worked up by cardiology 08-20-2022    SURGICAL HISTORY: Past Surgical History:  Procedure Laterality Date   APPENDECTOMY     CHOLECYSTECTOMY     COLONOSCOPY WITH PROPOFOL N/A 12/27/2016   Procedure: COLONOSCOPY WITH PROPOFOL;  Surgeon: Charlott Rakes, MD;  Location: [facility];  Service: Endoscopy;  Laterality: N/A;   CORONARY ARTERY BYPASS GRAFT  2005   x 4   EYE SURGERY Bilateral    ioc for cataracts   GOLD SEED IMPLANT N/A 04/09/2023   Procedure: GOLD SEED IMPLANT;  Surgeon: Belva Agee, MD;  Location: [facility];  Service: Urology;  Laterality: N/A;   liver tranplant   2013   at [facility]   PATELLA FRACTURE SURGERY Left 1990   PROSTATE BIOPSY     skin cancer area reved from head and nose     SPACE OAR INSTILLATION N/A 04/09/2023   Procedure: SPACE OAR INSTILLATION;  Surgeon: Belva Agee, MD;  Location: [facility];  Service: Urology;  Laterality: N/A;   surgery on broken leg Left 1991   multiple surgies with rod replaced and removed and redoved   VASECTOMY  I have reviewed the social history and family history with the patient and they are unchanged from previous note.  ALLERGIES:  is allergic to methotrexate.  MEDICATIONS:  Current Outpatient Medications  Medication Sig Dispense Refill   abiraterone acetate (ZYTIGA) 250 MG tablet Take 4 tablets (1,000 mg total) by mouth daily. Take on an empty stomach 1 hour before or 2 hours after a meal 120 tablet 2   aspirin EC 81 MG tablet Take  81 mg by mouth daily.     atorvastatin (LIPITOR) 10 MG tablet TAKE 1 TABLET BY MOUTH EVERY DAY IN THE EVENING 90 tablet 0   betamethasone dipropionate 0.05 % cream Apply topically as needed.     Calcium Carbonate-Vitamin D (CALCIUM + D PO) Take 1 tablet by mouth daily.     icosapent Ethyl (VASCEPA) 1 g capsule TAKE 2 CAPSULES BY MOUTH 2 TIMES DAILY. 360 capsule 3   Insulin Glargine (BASAGLAR KWIKPEN [place]) Inject 1 mL into the skin.     LANTUS SOLOSTAR 100 UNIT/ML Solostar Pen 34 Units every evening. 100 units q pm     Magnesium 300 MG CAPS Take 1 tablet by mouth 2 (two) times daily.     metFORMIN (GLUCOPHAGE) 1000 MG tablet Take 1,000 mg by mouth 2 (two) times daily.     metFORMIN (GLUCOPHAGE) 1000 MG tablet Take 1 tablet (1,000 mg total) by mouth 2 (two) times daily. 180 tablet 0   niacinamide 500 MG tablet niacinamide     omeprazole (PRILOSEC) 20 MG capsule Take 20 mg by mouth daily.     ONETOUCH ULTRA test strip USE TO TEST TWICE DAILY     predniSONE (DELTASONE) 5 MG tablet Take 1 tablet (5 mg total) by mouth daily with breakfast. 90 tablet 1   sildenafil (VIAGRA) 100 MG tablet SMARTSIG:0.5-1 Tablet(s) By Mouth PRN     tacrolimus (PROGRAF) 0.5 MG capsule Take by mouth.     tamsulosin (FLOMAX) 0.4 MG CAPS capsule Take 1 capsule (0.4 mg total) by mouth daily after supper. 30 capsule 5   tamsulosin (FLOMAX) 0.4 MG CAPS capsule Take 1 capsule (0.4 mg total) by mouth daily after supper. 30 capsule 5   No current facility-administered medications for this visit.    PHYSICAL EXAMINATION: ECOG PERFORMANCE STATUS: 1 - Symptomatic but completely ambulatory  Vitals:   07/16/23 1049  BP: (!) 147/79  Pulse: 67  Temp: (!) 97.5 F (36.4 C)  SpO2: 97%   Wt Readings from Last 3 Encounters:  07/16/23 187 lb 4.8 oz (85 kg)  04/18/23 181 lb 14.4 oz (82.5 kg)  04/09/23 176 lb 8 oz (80.1 kg)     GENERAL:alert, no distress and comfortable SKIN: skin color, texture, turgor are normal, no rashes or  significant lesions EYES: normal, Conjunctiva are pink and non-injected, sclera clear NECK: supple, thyroid normal size, non-tender, without nodularity LYMPH:  no palpable lymphadenopathy in the cervical, axillary  LUNGS: clear to auscultation and percussion with normal breathing effort HEART: regular rate & rhythm and no murmurs and no lower extremity edema ABDOMEN:abdomen soft, non-tender and normal bowel sounds Musculoskeletal:no cyanosis of digits and no clubbing  NEURO: alert & oriented x 3 with fluent speech, no focal motor/sensory deficits   LABORATORY DATA:  I have reviewed the data as listed    Latest Ref Rng & Units 07/16/2023   10:27 AM 04/18/2023   10:21 AM 04/09/2023   10:41 AM  CBC  WBC 4.0 - 10.5 K/uL 6.9  8.8  Hemoglobin 13.0 - 17.0 g/dL 16.1  09.6  04.5   Hematocrit 39.0 - 52.0 % 33.7  42.6  47.0   Platelets 150 - 400 K/uL 148  191          Latest Ref Rng & Units 07/16/2023   10:27 AM 04/18/2023   10:21 AM 04/09/2023   10:41 AM  CMP  Glucose 70 - 99 mg/dL 409  98  74   BUN 8 - 23 mg/dL 19  19  19    Creatinine 0.61 - 1.24 mg/dL 8.11  9.14  7.82   Sodium 135 - 145 mmol/L 131  133  137   Potassium 3.5 - 5.1 mmol/L 4.6  4.5  3.8   Chloride 98 - 111 mmol/L 99  98  101   CO2 22 - 32 mmol/L 25  28    Calcium 8.9 - 10.3 mg/dL 9.4  9.7    Total Protein 6.5 - 8.1 g/dL 7.0  7.6    Total Bilirubin 0.3 - 1.2 mg/dL 1.0  0.9    Alkaline Phos 38 - 126 U/L 81  73    AST 15 - 41 U/L 12  15    ALT 0 - 44 U/L 10  14        RADIOGRAPHIC STUDIES: I have personally reviewed the radiological images as listed and agreed with the findings in the report. No results found.    No orders of the defined types were placed in this encounter.  All questions were answered. The patient knows to call the clinic with any problems, questions or concerns. No barriers to learning was detected. The total time spent in the appointment was 25 minutes.     Malachy Mood, MD 07/16/2023

## 2023-07-17 LAB — PROSTATE-SPECIFIC AG, SERUM (LABCORP): Prostate Specific Ag, Serum: <0.1 ng/mL (ref 0.0–4.0)

## 2023-07-23 ENCOUNTER — Other Ambulatory Visit: Payer: Self-pay

## 2023-07-26 ENCOUNTER — Other Ambulatory Visit: Payer: Self-pay

## 2023-08-01 NOTE — Progress Notes (Signed)
RN left detailed message per patient request with appointment information for follow up with Dr. Cardell Peach.

## 2023-08-06 ENCOUNTER — Other Ambulatory Visit (HOSPITAL_COMMUNITY): Payer: Self-pay

## 2023-08-07 ENCOUNTER — Other Ambulatory Visit: Payer: Self-pay

## 2023-08-08 ENCOUNTER — Other Ambulatory Visit: Payer: Self-pay

## 2023-08-12 ENCOUNTER — Other Ambulatory Visit (HOSPITAL_COMMUNITY): Payer: Self-pay

## 2023-08-14 ENCOUNTER — Other Ambulatory Visit (HOSPITAL_COMMUNITY): Payer: Self-pay

## 2023-08-14 ENCOUNTER — Other Ambulatory Visit: Payer: Self-pay

## 2023-08-14 NOTE — Progress Notes (Signed)
Called patient to let him know about delay medication has to be order [employer]. Duncan Dull is aware & ordering  Ship 11/14 for 11/15.

## 2023-08-14 NOTE — Progress Notes (Signed)
Specialty Pharmacy Refill Coordination Note  [name] is a 77 y.o. male contacted today regarding refills of specialty medication(s) Abiraterone Acetate   Patient requested Delivery   Delivery date: 08/15/23   Verified address: [address] [place], [address]   Medication will be filled on 08/14/23.

## 2023-08-15 ENCOUNTER — Other Ambulatory Visit (HOSPITAL_COMMUNITY): Payer: Self-pay

## 2023-08-15 ENCOUNTER — Other Ambulatory Visit: Payer: Self-pay

## 2023-08-15 ENCOUNTER — Other Ambulatory Visit: Payer: Self-pay | Admitting: Urology

## 2023-08-15 DIAGNOSIS — C61 Malignant neoplasm of prostate: Secondary | ICD-10-CM

## 2023-08-19 ENCOUNTER — Encounter: Payer: Self-pay | Admitting: *Deleted

## 2023-08-20 ENCOUNTER — Ambulatory Visit
Admission: RE | Admit: 2023-08-20 | Discharge: 2023-08-20 | Disposition: A | Discharge status: Home / Self Care | Payer: Medicare HMO | Source: Ambulatory Visit | Attending: Radiation Oncology | Admitting: Radiation Oncology

## 2023-08-20 ENCOUNTER — Encounter: Payer: Self-pay | Admitting: *Deleted

## 2023-08-20 NOTE — Progress Notes (Signed)
  Radiation Oncology         [phone]) [phone] ________________________________  Name: [name] MRN: [id number]  Date of Service: 08/20/2023  DOB: [birth year]  Post Treatment Telephone Note  Diagnosis:  Stage IVB adenocarcinoma of the prostate with Gleason Score of 4+4 and PSA of 26.7 with low-volume metastatic disease involving the lungs (as documented in provider EOT note)  Pre Treatment IPSS Score: 12 (as documented in the provider consult note)  The patient was available for call today.   Symptoms of fatigue have improved since completing therapy.  Symptoms of bladder changes have improved since completing therapy. Current symptoms include none, and medications for bladder symptoms include Tamsulosin.  Symptoms of bowel changes have improved since completing therapy. Current symptoms include none, and medications for bowel symptoms include none.   Post Treatment IPSS Score: IPSS Questionnaire (AUA-7): Over the past month.   1)  How often have you had a sensation of not emptying your bladder completely after you finish urinating?  0 - Not at all  2)  How often have you had to urinate again less than two hours after you finished urinating? 0 - Not at all  3)  How often have you found you stopped and started again several times when you urinated?  0 - Not at all  4) How difficult have you found it to postpone urination?  0 - Not at all  5) How often have you had a weak urinary stream?  0 - Not at all  6) How often have you had to push or strain to begin urination?  0 - Not at all  7) How many times did you most typically get up to urinate from the time you went to bed until the time you got up in the morning?  2 - 2 times  Total score:  2. Which indicates mild symptoms  0-7 mildly symptomatic   8-19 moderately symptomatic   20-35 severely symptomatic    Patient has a scheduled follow up visit with his urologist, Dr. Cardell Peach, on 09/2023 for ongoing surveillance. He was counseled  that PSA levels will be drawn in the urology office, and was reassured that additional time is expected to improve bowel and bladder symptoms. He was encouraged to call back with concerns or questions regarding radiation.   This concludes the interaction.  Ruel Favors, LPN

## 2023-09-02 ENCOUNTER — Encounter: Payer: Self-pay | Admitting: *Deleted

## 2023-09-02 ENCOUNTER — Inpatient Hospital Stay: Payer: Medicare HMO | Attending: Adult Health | Admitting: *Deleted

## 2023-09-02 VITALS — BP 132/77 | HR 81 | Resp 18 | Ht 67.0 in | Wt 184.2 lb

## 2023-09-02 DIAGNOSIS — C61 Malignant neoplasm of prostate: Secondary | ICD-10-CM

## 2023-09-02 NOTE — Progress Notes (Signed)
SCP reviewed and completed. Vital signs WNL.Meds reviewed and updated.

## 2023-09-03 ENCOUNTER — Other Ambulatory Visit: Payer: Self-pay

## 2023-09-03 ENCOUNTER — Encounter: Payer: Self-pay | Admitting: *Deleted

## 2023-09-05 ENCOUNTER — Other Ambulatory Visit: Payer: Self-pay | Admitting: Radiation Oncology

## 2023-09-06 ENCOUNTER — Other Ambulatory Visit: Payer: Self-pay | Admitting: Hematology

## 2023-09-06 ENCOUNTER — Other Ambulatory Visit: Payer: Self-pay

## 2023-09-06 NOTE — Progress Notes (Signed)
Specialty Pharmacy Refill Coordination Note  [name] is a 77 y.o. male contacted today regarding refills of specialty medication(s) Abiraterone Acetate   Patient requested Delivery   Delivery date: 09/11/23   Verified address: [address], [place], [address]   Medication will be filled on 09/10/23, pending refill approval.

## 2023-09-10 ENCOUNTER — Other Ambulatory Visit: Payer: Self-pay

## 2023-09-10 ENCOUNTER — Other Ambulatory Visit (HOSPITAL_COMMUNITY): Payer: Self-pay

## 2023-09-10 MED ORDER — PREDNISONE 5 MG PO TABS: 5.0000 mg | ORAL_TABLET | Freq: Every day | ORAL | 1 refills | Status: AC

## 2023-09-10 MED ORDER — ABIRATERONE ACETATE 250 MG PO TABS
1000.0000 mg | ORAL_TABLET | Freq: Every day | ORAL | 0 refills | Status: DC
  Filled 2023-09-10: qty 120, 30d supply, fill #0

## 2023-09-10 NOTE — Progress Notes (Signed)
LVM for pt- RR was delayed, contacted MD's office and they sent over refill for today, will ship 12/11 and arrive at pt home on 12/12.  Dr. Latanya Maudlin office says this will be the last refill they will issue and patient should get the prescription written by Dr. Gwenette Greet office (Pt's urologist) in the future.

## 2023-09-13 ENCOUNTER — Other Ambulatory Visit (HOSPITAL_COMMUNITY): Payer: Self-pay

## 2023-09-13 ENCOUNTER — Other Ambulatory Visit: Payer: Self-pay

## 2023-09-13 NOTE — Progress Notes (Signed)
Patient was left a voice mail, medication will be same day couriered today 09/13/23 to Verified address: [address] [place], [address]

## 2023-09-16 ENCOUNTER — Encounter: Payer: Self-pay | Admitting: Family Medicine

## 2023-09-17 ENCOUNTER — Other Ambulatory Visit (HOSPITAL_COMMUNITY): Payer: Self-pay

## 2023-09-27 ENCOUNTER — Other Ambulatory Visit (HOSPITAL_COMMUNITY): Payer: Self-pay

## 2023-10-01 ENCOUNTER — Other Ambulatory Visit (HOSPITAL_COMMUNITY): Payer: Self-pay

## 2023-10-01 ENCOUNTER — Other Ambulatory Visit: Payer: Self-pay

## 2023-10-01 ENCOUNTER — Other Ambulatory Visit: Payer: Self-pay | Admitting: Hematology

## 2023-10-01 NOTE — Progress Notes (Signed)
 Specialty Pharmacy Ongoing Clinical Assessment Note  [name] is a 77 y.o. male who is being followed by the specialty pharmacy service for RxSp Oncology   Patient's specialty medication(s) reviewed today: Abiraterone  Acetate (ZYTIGA )   Missed doses in the last 4 weeks: 0   Patient/Caregiver did not have any additional questions or concerns.   Therapeutic benefit summary: Patient is achieving benefit   Adverse events/side effects summary: No adverse events/side effects   Patient's therapy is appropriate to: Continue    Goals Addressed             This Visit's Progress    Slow Disease Progression       Patient is on track. Patient will maintain adherence. PSA is <0.1 ng/mL on 07/16/23.         Follow up:  3 months  Silvano LOISE Dolly Specialty Pharmacist

## 2023-10-01 NOTE — Progress Notes (Signed)
 Specialty Pharmacy Refill Coordination Note  [name] is a 77 y.o. male contacted today regarding refills of specialty medication(s) Abiraterone  Acetate (ZYTIGA )   Patient requested [name] at [facility] at [place] date: 10/10/23   Medication will be filled on 10/09/23 *Pending refill request.

## 2023-10-01 NOTE — Telephone Encounter (Signed)
Pt having prostate care managed by his Urologist Dr. Galen Daft.

## 2023-10-03 ENCOUNTER — Other Ambulatory Visit (HOSPITAL_COMMUNITY): Payer: Self-pay

## 2023-10-03 ENCOUNTER — Other Ambulatory Visit: Payer: Self-pay

## 2023-10-03 MED ORDER — SILDENAFIL CITRATE 100 MG PO TABS
100.0000 mg | ORAL_TABLET | Freq: Every day | ORAL | 11 refills | Status: AC
  Filled 2023-10-03: qty 30, 30d supply, fill #0
  Filled 2023-11-11: qty 30, 30d supply, fill #1
  Filled 2023-12-06: qty 30, 30d supply, fill #2
  Filled 2023-12-30 – 2024-04-22 (×2): qty 30, 30d supply, fill #3
  Filled 2024-05-26: qty 30, 30d supply, fill #4

## 2023-10-03 MED ORDER — ABIRATERONE ACETATE 250 MG PO TABS
ORAL_TABLET | ORAL | 11 refills | Status: AC
  Filled 2023-10-03: qty 120, 30d supply, fill #0
  Filled 2023-11-06: qty 120, 30d supply, fill #1
  Filled 2023-12-06: qty 120, 30d supply, fill #2
  Filled 2023-12-30 – 2024-04-21 (×2): qty 120, 30d supply, fill #3

## 2023-10-03 MED ORDER — PREDNISONE 5 MG PO TABS
5.0000 mg | ORAL_TABLET | Freq: Every day | ORAL | 11 refills | Status: AC
  Filled 2023-10-03 (×2): qty 30, 30d supply, fill #0
  Filled 2023-10-30: qty 30, 30d supply, fill #1
  Filled 2023-12-06 (×2): qty 30, 30d supply, fill #2
  Filled 2023-12-30: qty 30, 30d supply, fill #3

## 2023-10-04 ENCOUNTER — Other Ambulatory Visit (HOSPITAL_COMMUNITY): Payer: Self-pay

## 2023-10-04 ENCOUNTER — Other Ambulatory Visit: Payer: Self-pay

## 2023-10-09 ENCOUNTER — Other Ambulatory Visit: Payer: Self-pay

## 2023-10-09 NOTE — Progress Notes (Signed)
 Patient no longer eligible for copay card due to medicare. LVM to notify of copayment $56.25 and initiated fill to [facility] for pickup as scheduled. No longer clinic patient, call center now.

## 2023-10-10 ENCOUNTER — Other Ambulatory Visit (HOSPITAL_COMMUNITY): Payer: Self-pay

## 2023-10-14 ENCOUNTER — Other Ambulatory Visit (HOSPITAL_COMMUNITY): Payer: Self-pay

## 2023-10-18 ENCOUNTER — Other Ambulatory Visit (HOSPITAL_COMMUNITY): Payer: Self-pay

## 2023-10-29 ENCOUNTER — Other Ambulatory Visit (HOSPITAL_COMMUNITY): Payer: Self-pay

## 2023-10-29 MED ORDER — ICOSAPENT ETHYL 1 G PO CAPS
1.0000 g | ORAL_CAPSULE | Freq: Two times a day (BID) | ORAL | 0 refills | Status: DC
  Filled 2023-10-29: qty 60, 30d supply, fill #0
  Filled 2023-11-23: qty 60, 30d supply, fill #1

## 2023-10-30 ENCOUNTER — Other Ambulatory Visit (HOSPITAL_COMMUNITY): Payer: Self-pay

## 2023-11-06 ENCOUNTER — Other Ambulatory Visit: Payer: Self-pay

## 2023-11-06 NOTE — Progress Notes (Signed)
 Specialty Pharmacy Refill Coordination Note  [name] is a 78 y.o. male contacted today regarding refills of specialty medication(s) Abiraterone  Acetate (ZYTIGA )   Patient requested [name] at [facility] at [place] date: 11/12/23   Medication will be filled on 02.10.25.

## 2023-11-08 ENCOUNTER — Other Ambulatory Visit (HOSPITAL_COMMUNITY): Payer: Self-pay

## 2023-11-11 ENCOUNTER — Other Ambulatory Visit: Payer: Self-pay

## 2023-11-11 ENCOUNTER — Other Ambulatory Visit (HOSPITAL_COMMUNITY): Payer: Self-pay

## 2023-11-12 ENCOUNTER — Other Ambulatory Visit (HOSPITAL_COMMUNITY): Payer: Self-pay

## 2023-11-12 ENCOUNTER — Other Ambulatory Visit: Payer: Self-pay

## 2023-11-12 MED ORDER — METFORMIN HCL 1000 MG PO TABS
1000.0000 mg | ORAL_TABLET | Freq: Two times a day (BID) | ORAL | 1 refills | Status: AC
  Filled 2023-11-12: qty 200, 100d supply, fill #0
  Filled 2024-08-28: qty 200, 100d supply, fill #1

## 2023-11-13 ENCOUNTER — Other Ambulatory Visit (HOSPITAL_COMMUNITY): Payer: Self-pay

## 2023-11-19 ENCOUNTER — Other Ambulatory Visit (HOSPITAL_COMMUNITY): Payer: Self-pay

## 2023-11-25 ENCOUNTER — Other Ambulatory Visit (HOSPITAL_COMMUNITY): Payer: Self-pay

## 2023-11-26 ENCOUNTER — Other Ambulatory Visit (HOSPITAL_COMMUNITY): Payer: Self-pay

## 2023-12-04 ENCOUNTER — Other Ambulatory Visit: Payer: Self-pay

## 2023-12-06 ENCOUNTER — Other Ambulatory Visit (HOSPITAL_COMMUNITY): Payer: Self-pay

## 2023-12-06 ENCOUNTER — Other Ambulatory Visit: Payer: Self-pay

## 2023-12-06 NOTE — Progress Notes (Signed)
 Specialty Pharmacy Refill Coordination Note  [name] is a 78 y.o. male contacted today regarding refills of specialty medication(s) Alan Acetate Roosvelt Maser)   Patient requested [name] at [facility] at [place] date: 12/09/23   Medication will be filled on 03.07.25.

## 2023-12-09 ENCOUNTER — Other Ambulatory Visit: Payer: Self-pay

## 2023-12-11 ENCOUNTER — Other Ambulatory Visit (HOSPITAL_COMMUNITY): Payer: Self-pay

## 2023-12-24 ENCOUNTER — Other Ambulatory Visit (HOSPITAL_COMMUNITY): Payer: Self-pay

## 2023-12-30 ENCOUNTER — Other Ambulatory Visit (HOSPITAL_COMMUNITY): Payer: Self-pay

## 2023-12-30 MED ORDER — ICOSAPENT ETHYL 1 G PO CAPS
1.0000 g | ORAL_CAPSULE | Freq: Two times a day (BID) | ORAL | 0 refills | Status: DC
  Filled 2023-12-30: qty 60, 30d supply, fill #0
  Filled 2024-01-26: qty 60, 30d supply, fill #1
  Filled 2024-02-28: qty 60, 30d supply, fill #2
  Filled 2024-03-25 – 2024-03-26 (×2): qty 60, 30d supply, fill #3

## 2023-12-30 NOTE — Progress Notes (Signed)
 Specialty Pharmacy Refill Coordination Note  [name] is a 78 y.o. male contacted today regarding refills of specialty medication(s) Abiraterone Acetate Roosvelt Maser)   Patient requested [name] at [facility] at [place] date: 01/02/24   Medication will be filled on 01/01/24.

## 2023-12-30 NOTE — Progress Notes (Signed)
 Specialty Pharmacy Ongoing Clinical Assessment Note  [name] is a 78 y.o. male who is being followed by the specialty pharmacy service for RxSp Oncology   Patient's specialty medication(s) reviewed today: Abiraterone Acetate (ZYTIGA)   Missed doses in the last 4 weeks: 0   Patient/Caregiver did not have any additional questions or concerns.   Therapeutic benefit summary: Patient is achieving benefit   Adverse events/side effects summary: Experienced adverse events/side effects (high blood glucose, MD is aware and monitoring)   Patient's therapy is appropriate to: Continue    Goals Addressed             This Visit's Progress    Slow Disease Progression   On track    Patient is on track. Patient will maintain adherence. PSA is <0.1 ng/mL on 07/16/23.         Follow up:  6 months  Servando Snare Specialty Pharmacist

## 2023-12-31 ENCOUNTER — Other Ambulatory Visit (HOSPITAL_COMMUNITY): Payer: Self-pay

## 2024-01-01 ENCOUNTER — Other Ambulatory Visit: Payer: Self-pay

## 2024-01-01 ENCOUNTER — Other Ambulatory Visit (HOSPITAL_COMMUNITY): Payer: Self-pay

## 2024-01-15 ENCOUNTER — Other Ambulatory Visit (HOSPITAL_COMMUNITY): Payer: Self-pay

## 2024-01-15 ENCOUNTER — Other Ambulatory Visit: Payer: Self-pay

## 2024-01-15 NOTE — Progress Notes (Signed)
 Patient's wife returned Specialty Pharmacy's call, and reported that the patient will no longer be taking Zytiga. Disenrolling patient from [facility].

## 2024-01-16 ENCOUNTER — Other Ambulatory Visit (HOSPITAL_COMMUNITY): Payer: Self-pay

## 2024-01-25 ENCOUNTER — Other Ambulatory Visit (HOSPITAL_COMMUNITY): Payer: Self-pay

## 2024-01-27 ENCOUNTER — Other Ambulatory Visit (HOSPITAL_COMMUNITY): Payer: Self-pay

## 2024-02-28 ENCOUNTER — Other Ambulatory Visit (HOSPITAL_COMMUNITY): Payer: Self-pay

## 2024-03-01 ENCOUNTER — Other Ambulatory Visit: Payer: Self-pay | Admitting: Radiation Oncology

## 2024-03-25 ENCOUNTER — Other Ambulatory Visit (HOSPITAL_COMMUNITY): Payer: Self-pay

## 2024-03-26 ENCOUNTER — Other Ambulatory Visit (HOSPITAL_COMMUNITY): Payer: Self-pay

## 2024-03-26 MED ORDER — ICOSAPENT ETHYL 1 G PO CAPS
1.0000 g | ORAL_CAPSULE | Freq: Two times a day (BID) | ORAL | 0 refills | Status: DC
  Filled 2024-03-26: qty 200, 100d supply, fill #0

## 2024-03-27 ENCOUNTER — Other Ambulatory Visit (HOSPITAL_COMMUNITY): Payer: Self-pay

## 2024-03-27 ENCOUNTER — Other Ambulatory Visit (HOSPITAL_BASED_OUTPATIENT_CLINIC_OR_DEPARTMENT_OTHER): Payer: Self-pay

## 2024-04-17 NOTE — Telephone Encounter (Addendum)
 Placed call to Pt's spouse to remind Pt to repeat labs this month. No answer, left vm. Spouse called back, will repeat labs at PCP who sends to [facility] for our labs.

## 2024-04-20 ENCOUNTER — Emergency Department (HOSPITAL_COMMUNITY)
Admission: EM | Admit: 2024-04-20 | Discharge: 2024-04-20 | Disposition: A | Discharge status: Home / Self Care | Attending: Emergency Medicine | Admitting: Emergency Medicine

## 2024-04-20 ENCOUNTER — Encounter (HOSPITAL_COMMUNITY): Payer: Self-pay

## 2024-04-20 ENCOUNTER — Other Ambulatory Visit: Payer: Self-pay

## 2024-04-20 ENCOUNTER — Emergency Department (HOSPITAL_COMMUNITY)

## 2024-04-20 DIAGNOSIS — S161XXA Strain of muscle, fascia and tendon at neck level, initial encounter: Secondary | ICD-10-CM | POA: Insufficient documentation

## 2024-04-20 DIAGNOSIS — Z951 Presence of aortocoronary bypass graft: Secondary | ICD-10-CM | POA: Insufficient documentation

## 2024-04-20 DIAGNOSIS — X58XXXA Exposure to other specified factors, initial encounter: Secondary | ICD-10-CM | POA: Insufficient documentation

## 2024-04-20 DIAGNOSIS — R55 Syncope and collapse: Secondary | ICD-10-CM | POA: Insufficient documentation

## 2024-04-20 DIAGNOSIS — Z7982 Long term (current) use of aspirin: Secondary | ICD-10-CM | POA: Insufficient documentation

## 2024-04-20 DIAGNOSIS — S199XXA Unspecified injury of neck, initial encounter: Secondary | ICD-10-CM | POA: Diagnosis present

## 2024-04-20 DIAGNOSIS — E119 Type 2 diabetes mellitus without complications: Secondary | ICD-10-CM | POA: Insufficient documentation

## 2024-04-20 DIAGNOSIS — Z7984 Long term (current) use of oral hypoglycemic drugs: Secondary | ICD-10-CM | POA: Insufficient documentation

## 2024-04-20 DIAGNOSIS — I2510 Atherosclerotic heart disease of native coronary artery without angina pectoris: Secondary | ICD-10-CM | POA: Insufficient documentation

## 2024-04-20 LAB — COMPREHENSIVE METABOLIC PANEL WITH GFR
ALT: 54 U/L — ABNORMAL HIGH (ref 0–44)
AST: 69 U/L — ABNORMAL HIGH (ref 15–41)
Albumin: 3.4 g/dL — ABNORMAL LOW (ref 3.5–5.0)
Alkaline Phosphatase: 107 U/L (ref 38–126)
Anion gap: 13 (ref 5–15)
BUN: 16 mg/dL (ref 8–23)
CO2: 20 mmol/L — ABNORMAL LOW (ref 22–32)
Calcium: 9.6 mg/dL (ref 8.9–10.3)
Chloride: 102 mmol/L (ref 98–111)
Creatinine, Ser: 0.99 mg/dL (ref 0.61–1.24)
GFR, Estimated: >60 mL/min (ref >60)
Glucose, Bld: 128 mg/dL — ABNORMAL HIGH (ref 70–99)
Potassium: 3.7 mmol/L (ref 3.5–5.1)
Sodium: 135 mmol/L (ref 135–145)
Total Bilirubin: 1.1 mg/dL (ref 0.0–1.2)
Total Protein: 7.8 g/dL (ref 6.5–8.1)

## 2024-04-20 LAB — URINALYSIS, ROUTINE W REFLEX MICROSCOPIC
Bilirubin Urine: NEGATIVE
Glucose, UA: NEGATIVE mg/dL
Hgb urine dipstick: NEGATIVE
Ketones, ur: NEGATIVE mg/dL
Leukocytes,Ua: NEGATIVE
Nitrite: NEGATIVE
Protein, ur: NEGATIVE mg/dL
Specific Gravity, Urine: 1.011 (ref 1.005–1.030)
pH: 6 (ref 5.0–8.0)

## 2024-04-20 LAB — CBC
HCT: 38.5 % — ABNORMAL LOW (ref 39.0–52.0)
Hemoglobin: 12.6 g/dL — ABNORMAL LOW (ref 13.0–17.0)
MCH: 25.6 pg — ABNORMAL LOW (ref 26.0–34.0)
MCHC: 32.7 g/dL (ref 30.0–36.0)
MCV: 78.3 fL — ABNORMAL LOW (ref 80.0–100.0)
Platelets: 179 K/uL (ref 150–400)
RBC: 4.92 MIL/uL (ref 4.22–5.81)
RDW: 15.2 % (ref 11.5–15.5)
WBC: 10.3 K/uL (ref 4.0–10.5)
nRBC: 0 % (ref 0.0–0.2)

## 2024-04-20 LAB — CBG MONITORING, ED: Glucose-Capillary: 120 mg/dL — ABNORMAL HIGH (ref 70–99)

## 2024-04-20 LAB — TROPONIN I (HIGH SENSITIVITY): Troponin I (High Sensitivity): 11 ng/L (ref <18)

## 2024-04-20 LAB — POC OCCULT BLOOD, ED: Fecal Occult Bld: NEGATIVE

## 2024-04-20 MED ORDER — KETOROLAC TROMETHAMINE 15 MG/ML IJ SOLN
15.0000 mg | Freq: Once | INTRAMUSCULAR | Status: AC
  Administered 2024-04-20: 15 mg via INTRAVENOUS
  Filled 2024-04-20: qty 1

## 2024-04-20 MED ORDER — SODIUM CHLORIDE 0.9 % IV BOLUS
1000.0000 mL | Freq: Once | INTRAVENOUS | Status: AC
  Administered 2024-04-20: 1000 mL via INTRAVENOUS

## 2024-04-20 NOTE — ED Notes (Signed)
 PT ambulated to restroom, denies dizziness

## 2024-04-20 NOTE — ED Notes (Signed)
 Pt unable to provide urine at this time

## 2024-04-20 NOTE — ED Provider Notes (Signed)
 [place] EMERGENCY DEPARTMENT AT [facility] Provider Note   CSN: [id number] Arrival date & time: 04/20/24  9365     Patient presents with: Loss of Consciousness   [name] is a 78 y.o. male with past medical history significant for previous CABG, GERD, CAD, diabetes, cirrhosis who presents for second syncopal episode within the last few weeks while sitting on the toilet trying to have a bowel movement.  History of malignant prostate cancer, not currently undergoing treatment.  Patient does report that [name] has been having some dark stools.  [name] denies any chest pain, shortness of breath prior to syncope.  [name] does feel quite thirsty.  Had previously been taking tamsulosin  but discontinued a few months ago, reports that [name] did take a pill last night though because they still have the pill bottle.   HPI     Prior to Admission medications   Medication Sig Start Date End Date Taking? Authorizing Provider  abiraterone  acetate (ZYTIGA ) 250 MG tablet Take 4 tablets by mouth daily. 10/02/23     aspirin EC 81 MG tablet Take 81 mg by mouth daily.    [provider]  atorvastatin  (LIPITOR) 10 MG tablet TAKE 1 TABLET BY MOUTH EVERY DAY IN THE EVENING 06/25/19   Ladona Heinz, MD  betamethasone dipropionate 0.05 % cream Apply topically as needed. Patient not taking: Reported on 09/02/2023    [provider]  Calcium  Carbonate-Vitamin D (CALCIUM  + D PO) Take 1 tablet by mouth daily.    [provider]  icosapent  Ethyl (VASCEPA ) 1 g capsule TAKE 2 CAPSULES BY MOUTH 2 TIMES DAILY. 09/06/21   Cantwell, Celeste C, PA-C  icosapent  Ethyl (VASCEPA ) 1 g capsule Take 1 capsule by mouth every 12 hours for hypertriglyceridemia.. 03/26/24     LANTUS SOLOSTAR 100 UNIT/ML Solostar Pen 34 Units every evening. 100 units q pm 06/13/21   [provider]  Magnesium 300 MG CAPS Take 1 tablet by mouth 2 (two) times daily. 07/27/20   [provider]  metFORMIN   (GLUCOPHAGE ) 1000 MG tablet Take 1 tablet (1,000 mg total) by mouth 2 (two) times daily. 05/01/23     metFORMIN  (GLUCOPHAGE ) 1000 MG tablet Take 1 tablet (1,000 mg total) by mouth 2 (two) times daily with breakfast and supper. 11/12/23     niacinamide 500 MG tablet niacinamide    [provider]  omeprazole (PRILOSEC) 20 MG capsule Take 20 mg by mouth daily.    [provider]  [facility] ULTRA test strip USE TO TEST TWICE DAILY 09/10/22   [provider]  predniSONE  (DELTASONE ) 5 MG tablet Take 1 tablet (5 mg total) by mouth daily with breakfast. 09/10/23   Lanny Callander, MD  predniSONE  (DELTASONE ) 5 MG tablet Take 1 tablet (5 mg total) by mouth daily. 10/02/23     sildenafil  (VIAGRA ) 100 MG tablet SMARTSIG:0.5-1 Tablet(s) By Mouth PRN Patient not taking: Reported on 09/02/2023 02/06/23   [provider]  sildenafil  (VIAGRA ) 100 MG tablet Take 1 tablet (100 mg total) by mouth daily. 09/17/23     tamsulosin  (FLOMAX ) 0.4 MG CAPS capsule Take 1 capsule (0.4 mg total) by mouth daily after supper. Patient not taking: Reported on 09/02/2023 05/17/23   Patrcia Cough, MD  tamsulosin  (FLOMAX ) 0.4 MG CAPS capsule TAKE 1 CAPSULE BY MOUTH 2 TIMES DAILY. 03/02/24   Patrcia Cough, MD    Allergies: Methotrexate    Review of Systems  All other systems reviewed and are negative.  Updated Vital Signs BP (!) 168/90   Pulse 73   Temp 97.7 F (36.5 C) (Oral)   Resp 15   SpO2 98%   Physical Exam Vitals and nursing note reviewed.  Constitutional:      General: [name] is not in acute distress.    Appearance: Normal appearance.  HENT:     Head: Normocephalic and atraumatic.  Eyes:     General:        Right eye: No discharge.        Left eye: No discharge.  Cardiovascular:     Rate and Rhythm: Normal rate and regular rhythm.     Heart sounds: No murmur heard.    No friction rub. No gallop.  Pulmonary:     Effort: Pulmonary effort is normal.     Breath sounds: Normal breath  sounds.  Abdominal:     General: Bowel sounds are normal.     Palpations: Abdomen is soft.  Genitourinary:    Comments: Some dark stool noted in rectal vault Musculoskeletal:     Comments: Focal cervical paraspinous muscle tenderness on the right  Skin:    General: Skin is warm and dry.     Capillary Refill: Capillary refill takes less than 2 seconds.  Neurological:     Mental Status: [name] is alert and oriented to person, place, and time.     Comments: Moves all 4 limbs spontaneously, CN II through XII grossly intact, normal sensation throughout. Normal coordination throughout.  Psychiatric:        Mood and Affect: Mood normal.        Behavior: Behavior normal.     (all labs ordered are listed, but only abnormal results are displayed) Labs Reviewed  CBC - Abnormal; Notable for the following components:      Result Value   Hemoglobin 12.6 (*)    HCT 38.5 (*)    MCV 78.3 (*)    MCH 25.6 (*)    All other components within normal limits  COMPREHENSIVE METABOLIC PANEL WITH GFR - Abnormal; Notable for the following components:   CO2 20 (*)    Glucose, Bld 128 (*)    Albumin 3.4 (*)    AST 69 (*)    ALT 54 (*)    All other components within normal limits  CBG MONITORING, ED - Abnormal; Notable for the following components:   Glucose-Capillary 120 (*)    All other components within normal limits  URINALYSIS, ROUTINE W REFLEX MICROSCOPIC  POC OCCULT BLOOD, ED  TROPONIN I (HIGH SENSITIVITY)    EKG: EKG Interpretation Date/Time:  Monday April 20 2024 06:48:45 EDT Ventricular Rate:  67 PR Interval:  226 QRS Duration:  90 QT Interval:  446 QTC Calculation: 471 R Axis:   -29  Text Interpretation: Sinus rhythm Prolonged PR interval Borderline left axis deviation Confirmed by Levander Houston [phone]) on 04/20/2024 8:48:13 AM  Radiology: [facility] Chest Portable 1 View Result Date: 04/20/2024 CLINICAL DATA:  78 year old male found down unconscious. Prostate cancer. EXAM: PORTABLE CHEST 1  VIEW COMPARISON:  CTA chest 05/06/2022 and earlier. FINDINGS: Portable AP semi upright view at 0703 hours. Chronic CABG, sternotomy. Lower lung volumes. Stable cardiac size and mediastinal contours. Visualized tracheal air column is within normal limits. Allowing for portable technique the lungs are clear. No pneumothorax or pleural effusion. Negative visible bowel gas. No acute osseous abnormality identified. IMPRESSION: Lower lung volumes.  No acute cardiopulmonary abnormality. Electronically Signed   By: VEAR Shona HERO.D.  On: 04/20/2024 07:19     Procedures   Medications Ordered in the ED  ketorolac  (TORADOL ) 15 MG/ML injection 15 mg (has no administration in time range)  sodium chloride  0.9 % bolus 1,000 mL (1,000 mLs Intravenous New Bag/Given 04/20/24 0758)                                    Medical Decision Making  This patient is a 78 y.o. male  who presents to the ED for concern of syncope.   Differential diagnoses prior to evaluation: The emergent differential diagnosis includes, but is not limited to,  CVA, ACS, arrhythmia, vasovagal / orthostatic hypotension, sepsis, hypoglycemia, electrolyte disturbance, respiratory failure, anemia, dehydration, heat injury, polypharmacy, malignancy, anxiety/panic attack . This is not an exhaustive differential.   Past Medical History / Co-morbidities / Social History: CABG, GERD, CAD, diabetes, cirrhosis  Physical Exam: Physical exam performed. The pertinent findings include: Moves all 4 limbs spontaneously, CN II through XII grossly intact, normal sensation throughout. Normal coordination throughout.   Genitourinary:    Comments: Some dark stool noted in rectal vault Musculoskeletal:     Comments: Focal cervical paraspinous muscle tenderness on the right   Moderate hypertension, blood pressure 168/90, send there was stable, [name] had 1 documented oxygen saturation of 83% that was not witnessed, I think inappropriately validated, [name] has had  normal oxygen saturation on room air throughout the duration of the rest of his visit.  Lab Tests/Imaging studies: I personally interpreted labs/imaging and the pertinent results include: CBC overall unremarkable, mild anemia, hemoglobin 12.6, UA unremarkable, negative troponin x 1 in context of no chest pain, no shortness of breath.  CMP mild bicarb deficit, CO2 20, diffusely elevated liver enzymes, AST 69, ALT 54 but normal total bilirubin, nonspecific, I do not think likely related to his current presentation.  Hemoccult is negative.  I independently interpreted plain film chest x-ray shows no evidence of acute intrathoracic abnormality.. I agree with the radiologist interpretation.  Cardiac monitoring: EKG obtained and interpreted by myself and attending physician which shows: NSR, no acute ST-T changes   Medications: I ordered medication including fluid bolus for suspected mild dehydration, Toradol  for neck pain..  I have reviewed the patients home medicines and have made adjustments as needed.   Disposition: After consideration of the diagnostic results and the patients response to treatment, I feel that patient with cervical muscle strain from doing yard work this week, otherwise [name] has a reassuring physical exam, reassuring lab findings, I suspect [name] had a vasovagal syncope related to being on the toilet along with some mild dehydration.SABRA   emergency department workup does not suggest an emergent condition requiring admission or immediate intervention beyond what has been performed at this time. The plan is: as above. The patient is safe for discharge and has been instructed to return immediately for worsening symptoms, change in symptoms or any other concerns.   Final diagnoses:  Vasovagal syncope  Strain of neck muscle, initial encounter    ED Discharge Orders     None          Rosan Sherlean VEAR DEVONNA 04/20/24 0950    Jerral Meth, MD 04/20/24 7675     Jerral Meth, MD 04/20/24 2326

## 2024-04-20 NOTE — Discharge Instructions (Addendum)
 Please use Tylenol or ibuprofen for pain.  You may use 600 mg ibuprofen every 6 hours or 1000 mg of Tylenol every 6 hours.  You may choose to alternate between the 2.  This would be most effective.  Not to exceed 4 g of Tylenol within 24 hours.  Not to exceed 3200 mg ibuprofen 24 hours.  Please drink plenty of fluids including electrolyte containing fluids, such as pedialyte, gatorade to help with dehydration.  If you have any additional episodes of passing out please return to the emergency department for further evaluation.

## 2024-04-20 NOTE — ED Triage Notes (Signed)
 Pt. Arrives for a syncopal episode on the toilet. Wife found patient unconscious. Unknown of how long he was unconscious. Pt. C/o weakness and neck pain.

## 2024-04-21 ENCOUNTER — Other Ambulatory Visit (HOSPITAL_COMMUNITY): Payer: Self-pay

## 2024-04-21 ENCOUNTER — Other Ambulatory Visit: Payer: Self-pay

## 2024-04-22 ENCOUNTER — Other Ambulatory Visit (HOSPITAL_COMMUNITY): Payer: Self-pay

## 2024-04-22 ENCOUNTER — Other Ambulatory Visit: Payer: Self-pay

## 2024-04-23 ENCOUNTER — Other Ambulatory Visit (HOSPITAL_COMMUNITY): Payer: Self-pay

## 2024-05-26 ENCOUNTER — Other Ambulatory Visit (HOSPITAL_COMMUNITY): Payer: Self-pay

## 2024-07-17 ENCOUNTER — Other Ambulatory Visit (HOSPITAL_COMMUNITY): Payer: Self-pay

## 2024-07-17 MED ORDER — ICOSAPENT ETHYL 1 G PO CAPS
1.0000 g | ORAL_CAPSULE | Freq: Two times a day (BID) | ORAL | 0 refills | Status: AC
  Filled 2024-07-17: qty 200, 100d supply, fill #0

## 2024-07-20 ENCOUNTER — Other Ambulatory Visit (HOSPITAL_COMMUNITY): Payer: Self-pay

## 2024-07-22 ENCOUNTER — Other Ambulatory Visit (HOSPITAL_COMMUNITY): Payer: Self-pay

## 2024-07-22 MED ORDER — TACROLIMUS 0.5 MG PO CAPS
0.5000 mg | ORAL_CAPSULE | Freq: Two times a day (BID) | ORAL | 3 refills | Status: AC
  Filled 2024-07-22: qty 60, 30d supply, fill #0
  Filled 2024-10-26: qty 180, 90d supply, fill #0

## 2024-08-28 ENCOUNTER — Other Ambulatory Visit (HOSPITAL_COMMUNITY): Payer: Self-pay

## 2024-09-10 ENCOUNTER — Other Ambulatory Visit (HOSPITAL_COMMUNITY): Payer: Self-pay

## 2024-09-11 ENCOUNTER — Other Ambulatory Visit (HOSPITAL_COMMUNITY): Payer: Self-pay

## 2024-09-21 ENCOUNTER — Other Ambulatory Visit (HOSPITAL_COMMUNITY): Payer: Self-pay

## 2024-10-26 ENCOUNTER — Other Ambulatory Visit (HOSPITAL_COMMUNITY): Payer: Self-pay

## 2024-10-27 ENCOUNTER — Other Ambulatory Visit: Payer: Self-pay

## 2024-10-28 ENCOUNTER — Other Ambulatory Visit (HOSPITAL_COMMUNITY): Payer: Self-pay

## 2024-10-29 ENCOUNTER — Other Ambulatory Visit: Payer: Self-pay

## 2024-10-29 ENCOUNTER — Other Ambulatory Visit (HOSPITAL_COMMUNITY): Payer: Self-pay

## 2024-10-29 MED ORDER — ICOSAPENT ETHYL 1 G PO CAPS
1.0000 g | ORAL_CAPSULE | Freq: Two times a day (BID) | ORAL | 0 refills | Status: AC | PRN
  Filled 2024-10-29: qty 200, 100d supply, fill #0

## 2024-10-29 MED ORDER — FENOFIBRATE 54 MG PO TABS
54.0000 mg | ORAL_TABLET | Freq: Every day | ORAL | 0 refills | Status: AC
  Filled 2024-10-29: qty 90, 90d supply, fill #0

## 2024-10-30 ENCOUNTER — Other Ambulatory Visit (HOSPITAL_COMMUNITY): Payer: Self-pay
